# Patient Record
Sex: Female | Born: 1985 | Race: White | Hispanic: No | Marital: Single | Smoking: Never smoker
Health system: Southern US, Community
[De-identification: ages and names within clinical notes are randomized; demographics above are authoritative.]

## PROBLEM LIST (undated history)

## (undated) DIAGNOSIS — R440 Auditory hallucinations: Secondary | ICD-10-CM

## (undated) DIAGNOSIS — F319 Bipolar disorder, unspecified: Secondary | ICD-10-CM

## (undated) HISTORY — PX: VENTRICULOPERITONEAL SHUNT: SHX204

## (undated) HISTORY — PX: EYE SURGERY: SHX253

---

## 2015-11-27 ENCOUNTER — Encounter (HOSPITAL_COMMUNITY): Payer: Self-pay | Admitting: *Deleted

## 2015-11-27 DIAGNOSIS — Z3202 Encounter for pregnancy test, result negative: Secondary | ICD-10-CM | POA: Insufficient documentation

## 2015-11-27 DIAGNOSIS — F22 Delusional disorders: Secondary | ICD-10-CM | POA: Insufficient documentation

## 2015-11-27 DIAGNOSIS — F29 Unspecified psychosis not due to a substance or known physiological condition: Secondary | ICD-10-CM | POA: Insufficient documentation

## 2015-11-27 DIAGNOSIS — F419 Anxiety disorder, unspecified: Secondary | ICD-10-CM | POA: Insufficient documentation

## 2015-11-27 DIAGNOSIS — F17200 Nicotine dependence, unspecified, uncomplicated: Secondary | ICD-10-CM | POA: Insufficient documentation

## 2015-11-27 LAB — CBC
HCT: 43.6 % (ref 36.0–46.0)
HEMOGLOBIN: 15.3 g/dL — AB (ref 12.0–15.0)
MCH: 30.8 pg (ref 26.0–34.0)
MCHC: 35.1 g/dL (ref 30.0–36.0)
MCV: 87.7 fL (ref 78.0–100.0)
Platelets: 192 10*3/uL (ref 150–400)
RBC: 4.97 MIL/uL (ref 3.87–5.11)
RDW: 12.8 % (ref 11.5–15.5)
WBC: 7.5 10*3/uL (ref 4.0–10.5)

## 2015-11-27 LAB — URINALYSIS, ROUTINE W REFLEX MICROSCOPIC
BILIRUBIN URINE: NEGATIVE
Glucose, UA: NEGATIVE mg/dL
Hgb urine dipstick: NEGATIVE
KETONES UR: NEGATIVE mg/dL
Leukocytes, UA: NEGATIVE
NITRITE: NEGATIVE
Protein, ur: NEGATIVE mg/dL
Specific Gravity, Urine: 1.007 (ref 1.005–1.030)
pH: 5.5 (ref 5.0–8.0)

## 2015-11-27 LAB — RAPID URINE DRUG SCREEN, HOSP PERFORMED
Amphetamines: NOT DETECTED
BARBITURATES: NOT DETECTED
Benzodiazepines: NOT DETECTED
Cocaine: NOT DETECTED
Opiates: NOT DETECTED
TETRAHYDROCANNABINOL: NOT DETECTED

## 2015-11-27 LAB — ETHANOL

## 2015-11-27 LAB — COMPREHENSIVE METABOLIC PANEL
ALT: 16 U/L (ref 14–54)
AST: 29 U/L (ref 15–41)
Albumin: 4.4 g/dL (ref 3.5–5.0)
Alkaline Phosphatase: 82 U/L (ref 38–126)
Anion gap: 15 (ref 5–15)
CHLORIDE: 103 mmol/L (ref 101–111)
CO2: 23 mmol/L (ref 22–32)
CREATININE: 0.79 mg/dL (ref 0.44–1.00)
Calcium: 9.7 mg/dL (ref 8.9–10.3)
GFR calc Af Amer: >60 mL/min (ref >60)
Glucose, Bld: 127 mg/dL — ABNORMAL HIGH (ref 65–99)
Potassium: 4.3 mmol/L (ref 3.5–5.1)
Sodium: 141 mmol/L (ref 135–145)
Total Bilirubin: 1.1 mg/dL (ref 0.3–1.2)
Total Protein: 7.8 g/dL (ref 6.5–8.1)

## 2015-11-27 LAB — POC URINE PREG, ED: PREG TEST UR: NEGATIVE

## 2015-11-27 LAB — ACETAMINOPHEN LEVEL: Acetaminophen (Tylenol), Serum: <10 ug/mL — ABNORMAL LOW (ref 10–30)

## 2015-11-27 LAB — SALICYLATE LEVEL: Salicylate Lvl: <4 mg/dL (ref 2.8–30.0)

## 2015-11-27 NOTE — ED Notes (Signed)
Spoke with Tobi Bastosnna (TTS)  Stated once medically cleared we should place a TTS consult

## 2015-11-27 NOTE — ED Notes (Signed)
Leslie Farrell [phone]) from [facility] has assessment completed.  Papers with triage nurse.  Mother here with patient.  No homicidal or suicidal ideations just voices and sometimes they are talking to each other.

## 2015-11-27 NOTE — ED Notes (Signed)
Patient presents with Mother stating "she needs a psych eval".  Patient with flat affect.  Mother states she had an episode like this 6-7 weeks ago while she was in [place].  Delusional, hearing voices but denies wanting to hurt herself or others.

## 2015-11-28 ENCOUNTER — Emergency Department (HOSPITAL_COMMUNITY)
Admission: EM | Admit: 2015-11-28 | Discharge: 2015-11-29 | Disposition: A | Discharge status: Other Acute Inpatient Hospital | Payer: Self-pay | Attending: Emergency Medicine | Admitting: Emergency Medicine

## 2015-11-28 DIAGNOSIS — F23 Brief psychotic disorder: Secondary | ICD-10-CM

## 2015-11-28 DIAGNOSIS — R440 Auditory hallucinations: Secondary | ICD-10-CM

## 2015-11-28 HISTORY — DX: Auditory hallucinations: R44.0

## 2015-11-28 MED ORDER — ZIPRASIDONE MESYLATE 20 MG IM SOLR
20.0000 mg | Freq: Once | INTRAMUSCULAR | Status: AC
  Administered 2015-11-28: 20 mg via INTRAMUSCULAR
  Filled 2015-11-28: qty 20

## 2015-11-28 MED ORDER — STERILE WATER FOR INJECTION IJ SOLN
INTRAMUSCULAR | Status: AC
  Administered 2015-11-28: 10 mL
  Filled 2015-11-28: qty 10

## 2015-11-28 NOTE — ED Notes (Signed)
pts mom called, pt was asking her mom

## 2015-11-28 NOTE — ED Notes (Signed)
Pt walking to bathroom   
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
## 2015-11-28 NOTE — ED Notes (Signed)
Meal given

## 2015-11-28 NOTE — ED Notes (Signed)
Placed original IVC copy, in red folder in Case Managers Office.

## 2015-11-28 NOTE — ED Notes (Signed)
Pt started screaming uncontrollable and shoved chairs and her bedside table to barricade herself.  This RN talked with pt in a calling manner and helped her deescalate. MD was in hallway and gave orders for IM medication and started IVC paperwork.  IM ordered.

## 2015-11-28 NOTE — ED Notes (Signed)
Pt tried to leave her room and go to the lobby.  Pt appears confused and doesn't understand why she is here.  RN used calming techniques to talk with patient.  Pt back in room.  Pt is tearful.

## 2015-11-28 NOTE — ED Notes (Signed)
Pt attempted to leave the unit, she was redirected and went back to her room.  Pt was tearful and mute.

## 2015-11-28 NOTE — ED Notes (Signed)
Faxed IVC papers to the After Hours Magistrate @ 16:51pm

## 2015-11-28 NOTE — ED Notes (Signed)
MD filled out IVC and this RN gave to Diplomatic Services operational officersecretary to [employer] and fax to [employer]istrate.

## 2015-11-28 NOTE — ED Notes (Signed)
Mother at bedside.

## 2015-11-28 NOTE — ED Notes (Signed)
Regular lunch tray ordered for pt @ 6:59a

## 2015-11-28 NOTE — Progress Notes (Addendum)
Spoke with pt's mother Leslie Farrell at [phone]. Mother requests to be updated when pt is placed. Mother states pt lived in [place], where family is originally from, until 8 weeks ago when [place]. Mother explains for that reason details of pt's mental health issues during past few months are not entirely known and are now being discovered by family. Mother states 1 yr ago pt broke up with long-term boyfriend and lsot her job, and she seemed to decompensate at that time and saw a psychologist for therapy. States psychologist referred pt to psychiatry for medication and pt took lamotrigine and propanolol for unknown amount of time. States pt reported it was ineffective and "made her feel worse, and she didn't have the finances to keep seeing the psychiatrist." States she believes pt was diagnosed with bipolar disorder at the time. Has never been admitted to inpatient psychiatric treatment.   Mother states that pt seemed fixated on conspiracy theories and somewhat paranoid about others being out to get her when she initially moved home, and family attributed this to her having recently stopped taking her medications. States pt seemed to improve for a few weeks, however, mother states that, upon parents returning from out of town this past weekend, they observed pt had significantly decompensated further: paranoid, disoriented, confused, not sleeping or eating. States this is the first occurrence of psychotic symptoms to their knowledge. Reports there is a family hx of schizophrenia (pt's cousin).  Reports pt is not currently [place] and applied for MCD when first moved but was told she did not qualify. States they plan to apply again in order to access more MH services for pt unless she begins working and accesses other health insurance in the meantime.  Mother aware inpatient treatment is being recommended and hopes to be involved in pt's treatment once admitted in  order to "help give an accurate history and description of her- she can't express herself effectively in this state and I know feedback will be important when finding the right medications and treatment for this." States, "our biggest fear is that she'll be transferred somewhere and we won't be able to find her." Mother requests to be kept updated when possible at number above.   [facility]- per Christiane H[facility]- per [facility]- per [facility] MartinsKristin [facility]- per Billie  [facility] and will refer if there is bed availability. Also [facility] upon appropriate bed availability.  [facility]: [facility]  Ilean SkillMeghan Kristina Mcnorton, MSW, LCSW Clinical Social Work, Disposition  11/28/2015 [phone]

## 2015-11-28 NOTE — ED Notes (Signed)
Pts mother took all of pts belongings home with her.

## 2015-11-28 NOTE — ED Notes (Signed)
Patient stated that she wanted to take a shower. Was given items to shower with and patient now yelling "no, not right now!" and turns in bed with covers over her face. Items placed at bedside for when pt is ready.

## 2015-11-28 NOTE — ED Provider Notes (Signed)
CSN: [id number]     Arrival date & time 11/27/15  1941 History   First MD Initiated Contact with Patient 11/28/15 0031     Chief Complaint  Patient presents with  . Psychiatric Evaluation     (Consider location/radiation/quality/duration/timing/severity/associated sxs/prior Treatment) HPI Comments: 30 year old female presents to the emergency department for psychiatric evaluation. Patient has been experiencing hallucinations for the past 2 months, per mother. Symptoms have been waxing and waning in severity. Patient, most recently, reports hearing voices for 2-3 days. She states that the voices are telling her about wars. Mother reports that patient seems concerned about conspiracies. Patient has noted tinnitus in her bilateral ears as well as a buzzing sensation to the top of her head. She has taken ibuprofen for these symptoms. Patient has previously been on propranolol and lamotrigine for hallucinations which, patient states, provided her little relief. No suicidal or homicidal ideations. Patient denies and hallucinations.  The history is provided by the patient and a parent. No language interpreter was used.    Past Medical History  Diagnosis Date  . Hearing voices    History reviewed. No pertinent past surgical history. No family history on file. Social History  Substance Use Topics  . Smoking status: Current Some Day Smoker  . Smokeless tobacco: Never Used  . Alcohol Use: Yes   OB History    No data available      Review of Systems  Psychiatric/Behavioral: Positive for hallucinations and behavioral problems. The patient is nervous/anxious.   All other systems reviewed and are negative.   Allergies  Review of patient's allergies indicates no known allergies.  Home Medications   Prior to Admission medications   Not on File   BP 136/91 mmHg  Pulse 72  Temp(Src) 98.5 F (36.9 C) (Oral)  Resp 18  SpO2 100%  LMP 11/21/2015   Physical Exam  Constitutional: She is  oriented to person, place, and time. She appears well-developed and well-nourished. No distress.  HENT:  Head: Normocephalic and atraumatic.  Eyes: Conjunctivae and EOM are normal. No scleral icterus.  Neck: Normal range of motion.  Pulmonary/Chest: Effort normal. No respiratory distress. She has no wheezes.  Musculoskeletal: Normal range of motion.  Neurological: She is alert and oriented to person, place, and time. She exhibits normal muscle tone. Coordination normal.  Skin: Skin is warm and dry. No rash noted. She is not diaphoretic. No erythema. No pallor.  Psychiatric: Her speech is normal. Her mood appears anxious. She is slowed (mild) and withdrawn. Thought content is paranoid. She expresses no homicidal and no suicidal ideation.  Nursing note and vitals reviewed.   ED Course  Procedures (including critical care time) Labs Review Labs Reviewed  COMPREHENSIVE METABOLIC PANEL - Abnormal; Notable for the following:    Glucose, Bld 127 (*)    BUN <5 (*)    All other components within normal limits  ACETAMINOPHEN LEVEL - Abnormal; Notable for the following:    Acetaminophen (Tylenol), Serum <10 (*)    All other components within normal limits  CBC - Abnormal; Notable for the following:    Hemoglobin 15.3 (*)    All other components within normal limits  ETHANOL  SALICYLATE LEVEL  URINE RAPID DRUG SCREEN, HOSP PERFORMED  URINALYSIS, ROUTINE W REFLEX MICROSCOPIC (NOT AT [facility])  POC URINE PREG, ED    Imaging Review No results found. I have personally reviewed and evaluated these images and lab results as part of my medical decision-making.   EKG Interpretation None  MDM   Final diagnoses:  Auditory hallucinations  Acute psychosis    Patient medically cleared. She is pending inpatient placement for further psychiatric management. Disposition to be determined by oncoming ED provider.   Filed Vitals:   11/27/15 2003 11/28/15 0100  BP: 145/88 136/91  Pulse: 84 72   Temp: 98.5 F (36.9 C)   TempSrc: Oral   Resp: 18   SpO2: 100% 100%      Antony Madura, PA-C 11/28/15 [id number]  Alvira Monday, MD 12/01/15 2049

## 2015-11-28 NOTE — ED Notes (Signed)
Notified staffing for a sitter.

## 2015-11-28 NOTE — ED Notes (Signed)
Got breakfast tray

## 2015-11-28 NOTE — ED Notes (Signed)
Mother went home pt resting

## 2015-11-28 NOTE — Progress Notes (Addendum)
Patient's mom called Clinical research associatewriter inquiring about phone number for RN so that she could ask RN about type of clothing that she could bring to the ED for patient. [facility] phone number provided.  Melbourne Abtsatia Kyran Connaughton, LCSWA Disposition staff 11/28/2015 4:10 PM

## 2015-11-28 NOTE — ED Notes (Signed)
Pt up to ambulatory to BR

## 2015-11-28 NOTE — ED Notes (Signed)
Pt voluntarily received an IM medication and was able to ambulate to call her mother.  Pt is in her room, laying down with eyes closed.  Sitter at bedside.

## 2015-11-28 NOTE — ED Notes (Addendum)
Pt yelling but has no complaints when asked.  Pt states she talked to this RN about going home this RN stated that they are looking for placement and she is not going home at this time.  Pt is convinced that she had a conversation that didn't happen.pt wondering around room in a confused manner.

## 2015-11-29 NOTE — ED Provider Notes (Signed)
[facility]...  EMTALA completed  Eber HongBrian Zarina Pe, MD 11/29/15 [phone]

## 2015-11-29 NOTE — ED Notes (Signed)
Pts shoes and bra inventoried and placed in storage

## 2015-11-29 NOTE — ED Notes (Signed)
Patient sleeping at this time. Will do VS again at 6am.

## 2015-11-29 NOTE — ED Notes (Signed)
Magistrate's office called regarding patient not being served following IVC paperwork. IVC papers resent to magistrate.

## 2015-11-29 NOTE — ED Notes (Addendum)
Pt ambulated to the restroom in a steady manner.  Pt eating and drinking without issue.  Calm at this time.

## 2015-11-29 NOTE — ED Notes (Signed)
Patient was given a snack and drink, a regular diet ordered for lunch. 

## 2015-11-29 NOTE — ED Notes (Signed)
Patient ambulatory to restroom with steady gait, NAD noted.

## 2015-11-29 NOTE — ED Notes (Signed)
IVC paperwork finally reached Magistrate's office. Magistrate called Marylene Landngela, NS back, stating that the reasoning is "inconclusive" and that there must be facts stating why the patient must be involuntarily committed. Will inform MD to re-evaluate and complete IVC paperwork again.

## 2015-11-29 NOTE — ED Notes (Signed)
IVC papers were not approved by the Magistrate.  Pt is not in crisis at this time, MD will wait on IVC papers if patient becomes a danger to herself or others.

## 2015-11-29 NOTE — Progress Notes (Addendum)
[facility] Dr [facility]iden[facility] has accepted pt for admission to adult unit. Report number is [id number], Leslie Farrell requThayer Farrell report be called when transport has arrived for pt. [facility] [address], [place], [place] [address]  Pt is not under IVC at this time. Leslie Farrell states that voluntary admission is offered, "with understanding that our policy is, if pt arrives here and is no longer willing to sign self in voluntarily, she will be returned to the sending facility."   Attempted to reach pt's mother to inform her of placement- left voicemail. However, [facility], mother present in ED and aware of pending transfer.   Leslie Farrell, MSW, LCSW Clinical Social Work, Disposition  11/29/2015 [phone]

## 2015-11-29 NOTE — Progress Notes (Signed)
Received call from pt's mother [place]indy [place] [phone]. Mother requests update re: pt's case. Mother frustrated that visiting hours are limited in ED and she cannot be present to "help her manage her affairs." Mother upset that pt may "wait several days for a bed." CSW informed mother that pt is [facility] as soon as there is bed availability, but that placement is being sought at any other appropriate facility in the state in the meantime and order to ensure pt access treatment as quickly as possible. Pt's mother expresses frustration that, although she wants pt transferred to treatment, "she could have to go 3 hours away and not have family support there." CSW offered support for mother's concerns while maintaining that pt is being referred in efforts to have her access timely treatment. Mother asks "what is she supposed to do about her financial affairs in the meantime? I can't become her POA until she can sign it lucidly." CSW stated cannot advise as to financial concerns or POA/legal affairs.   Pt under review at: [facility]- per Sabino NiemannKristin [facility]- [facility]  Sent referral again with updated vitals/ED notes to: [facility]- per [facility]- [facility]  [place], [facility], [facility] at [facility].  Ilean SkillMeghan Tonyetta Berko, MSW, LCSW Clinical Social Work, Disposition  11/29/2015 [phone]

## 2016-02-14 ENCOUNTER — Encounter (HOSPITAL_COMMUNITY): Payer: Self-pay | Admitting: Emergency Medicine

## 2016-02-14 ENCOUNTER — Emergency Department (HOSPITAL_COMMUNITY)
Admission: EM | Admit: 2016-02-14 | Discharge: 2016-02-14 | Disposition: A | Discharge status: Home / Self Care | Payer: BLUE CROSS/BLUE SHIELD | Attending: Emergency Medicine | Admitting: Emergency Medicine

## 2016-02-14 ENCOUNTER — Emergency Department (HOSPITAL_COMMUNITY): Payer: BLUE CROSS/BLUE SHIELD

## 2016-02-14 DIAGNOSIS — Y939 Activity, unspecified: Secondary | ICD-10-CM | POA: Diagnosis not present

## 2016-02-14 DIAGNOSIS — Z79899 Other long term (current) drug therapy: Secondary | ICD-10-CM | POA: Insufficient documentation

## 2016-02-14 DIAGNOSIS — Y92009 Unspecified place in unspecified non-institutional (private) residence as the place of occurrence of the external cause: Secondary | ICD-10-CM | POA: Diagnosis not present

## 2016-02-14 DIAGNOSIS — F17200 Nicotine dependence, unspecified, uncomplicated: Secondary | ICD-10-CM | POA: Diagnosis not present

## 2016-02-14 DIAGNOSIS — S51851A Open bite of right forearm, initial encounter: Secondary | ICD-10-CM | POA: Insufficient documentation

## 2016-02-14 DIAGNOSIS — Y999 Unspecified external cause status: Secondary | ICD-10-CM | POA: Diagnosis not present

## 2016-02-14 DIAGNOSIS — W540XXA Bitten by dog, initial encounter: Secondary | ICD-10-CM | POA: Insufficient documentation

## 2016-02-14 MED ORDER — LIDOCAINE-EPINEPHRINE (PF) 1 %-1:200000 IJ SOLN
INTRAMUSCULAR | Status: AC
  Administered 2016-02-14: 30 mL
  Filled 2016-02-14: qty 30

## 2016-02-14 MED ORDER — TETANUS-DIPHTH-ACELL PERTUSSIS 5-2.5-18.5 LF-MCG/0.5 IM SUSP
0.5000 mL | Freq: Once | INTRAMUSCULAR | Status: AC
  Administered 2016-02-14: 0.5 mL via INTRAMUSCULAR
  Filled 2016-02-14: qty 0.5

## 2016-02-14 MED ORDER — IBUPROFEN 600 MG PO TABS: 600.0000 mg | ORAL_TABLET | Freq: Four times a day (QID) | ORAL | Status: DC | PRN

## 2016-02-14 MED ORDER — LIDOCAINE-EPINEPHRINE (PF) 2 %-1:200000 IJ SOLN: 10.0000 mL | Freq: Once | INTRAMUSCULAR | Status: DC

## 2016-02-14 MED ORDER — HYDROCODONE-ACETAMINOPHEN 5-325 MG PO TABS: 1.0000 | ORAL_TABLET | ORAL | Status: DC | PRN

## 2016-02-14 MED ORDER — OXYCODONE-ACETAMINOPHEN 5-325 MG PO TABS
1.0000 | ORAL_TABLET | ORAL | Status: DC | PRN
  Administered 2016-02-14: 1 via ORAL
  Filled 2016-02-14: qty 1

## 2016-02-14 MED ORDER — AMOXICILLIN-POT CLAVULANATE 875-125 MG PO TABS: 1.0000 | ORAL_TABLET | Freq: Two times a day (BID) | ORAL | Status: DC
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
## 2016-02-14 NOTE — ED Notes (Signed)
PA at bedside.

## 2016-02-14 NOTE — ED Notes (Signed)
Pt reports understanding of discharge information. No questions at time of discharge 

## 2016-02-14 NOTE — Discharge Instructions (Signed)
Take your medications as prescribed. You may also apply ice to affected areas for 15-20 minutes 3-4 times daily to help with pain and swelling. Keep wound clean using antibacterial soap (Dial) and water and pat dry.  I recommend following up with your primary care provider in 2 days for wound recheck. Return to the ED or be seen by your primary care provider in 7 days for suture removal.  Please return to the Emergency Department if symptoms worsen or new onset of fever, swelling, redness, drainage, numbness, tingling, weakness, decreased range of motion of right arm.

## 2016-02-14 NOTE — ED Provider Notes (Signed)
CSN: [id number]     Arrival date & time 02/14/16  1934 History   First MD Initiated Contact with Patient 02/14/16 2110     Chief Complaint  Patient presents with  . Animal Bite     (Consider location/radiation/quality/duration/timing/severity/associated sxs/prior Treatment) HPI   Patient is a 30 year old female with past medical history of schizophrenia who presents to the ED with report of dog bite, onset 7 PM. Patient reports she is feeding her 2 dogs when they began to fight between each other. She notes she tried to break them up which resulted in one of her dogs biting her right forearm. She reports having two wounds to her right forearm, bleeding controlled. Denies fever,  numbness, tingling, weakness. Tetanus not UTD. Pt reports the dog's vaccines and rabies are UTD.   Past Medical History  Diagnosis Date  . Hearing voices    History reviewed. No pertinent past surgical history. No family history on file. Social History  Substance Use Topics  . Smoking status: Current Some Day Smoker  . Smokeless tobacco: Never Used  . Alcohol Use: Yes   OB History    No data available     Review of Systems  Constitutional: Negative for fever.  Musculoskeletal: Positive for myalgias (right forearm).  Skin: Positive for wound.  Neurological: Negative for weakness and numbness.      Allergies  Review of patient's allergies indicates no known allergies.  Home Medications   Prior to Admission medications   Medication Sig Start Date End Date Taking? Authorizing Provider  citalopram (CELEXA) 20 MG tablet TK 1 T PO  QAM 02/06/16  Yes Historical Provider, MD  OLANZapine (ZYPREXA) 10 MG tablet TK ONE T PO Q NIGHT 02/06/16  Yes Historical Provider, MD  amoxicillin-clavulanate (AUGMENTIN) 875-125 MG tablet Take 1 tablet by mouth every 12 (twelve) hours. 02/14/16   Barrett Henle, PA-C  HYDROcodone-acetaminophen (NORCO/VICODIN) 5-325 MG tablet Take 1 tablet by mouth every 4 (four) hours  as needed. 02/14/16   Barrett Henle, PA-C  ibuprofen (ADVIL,MOTRIN) 600 MG tablet Take 1 tablet (600 mg total) by mouth every 6 (six) hours as needed. 02/14/16   Satira Sark Ansen Sayegh, PA-C   BP 112/73 mmHg  Pulse 98  Temp(Src) 99 F (37.2 C) (Oral)  Resp 18  SpO2 100%  LMP 02/13/2016 Physical Exam  Constitutional: She is oriented to person, place, and time. She appears well-developed and well-nourished.  HENT:  Head: Normocephalic and atraumatic.  Eyes: Conjunctivae and EOM are normal. Right eye exhibits no discharge. Left eye exhibits no discharge. No scleral icterus.  Neck: Normal range of motion. Neck supple.  Cardiovascular: Normal rate and intact distal pulses.   Pulmonary/Chest: Effort normal. No respiratory distress.  Abdominal: Soft. She exhibits no distension.  Musculoskeletal: Normal range of motion. She exhibits tenderness. She exhibits no edema.       Left forearm: She exhibits tenderness and laceration. She exhibits no bony tenderness, no swelling, no edema and no deformity.       Arms: 3x1cm superficial laceration noted volar aspect of right proximal forearm, no active bleeding.  0.5x0.5cm puncture wound noted to dorsal aspect of right proximal forearm, no active bleeding.  TTP around wounds.  FROM of right shoulder, elbow, forearm, wrist and hand with 5/5 strength. 2+ radial pulses. Sensation grossly intact.   Neurological: She is alert and oriented to person, place, and time.  Skin: Skin is warm and dry.  Nursing note and vitals reviewed.   ED Course  .Marland Kitchen  Laceration Repair Date/Time: 02/14/2016 11:10 PM Performed by: Barrett HenleNADEAU, Aarini Slee ELIZABETH Authorized by: Barrett HenleNADEAU, Lasaundra Riche ELIZABETH Consent: Verbal consent obtained. Risks and benefits: risks, benefits and alternatives were discussed Consent given by: patient Patient understanding: patient states understanding of the procedure being performed Patient identity confirmed: verbally with patient Body area: upper  extremity Location details: right upper arm Laceration length: 3 cm Foreign bodies: no foreign bodies Tendon involvement: none Nerve involvement: none Vascular damage: no Anesthesia: local infiltration Local anesthetic: lidocaine 2% with epinephrine Anesthetic total: 4 ml Preparation: Patient was prepped and draped in the usual sterile fashion. Irrigation solution: saline Irrigation method: syringe Amount of cleaning: extensive Skin closure: Ethilon (4-0) Number of sutures: 5 Technique: simple Approximation: close Approximation difficulty: simple Dressing: 4x4 sterile gauze Patient tolerance: Patient tolerated the procedure well with no immediate complications Comments: Puncture wound was also copiously irrigated with NS and wound was left open.    (including critical care time) Labs Review Labs Reviewed - No data to display  Imaging Review Dg Forearm Right  02/14/2016  CLINICAL DATA:  Dog bite proximal forearm today EXAM: RIGHT FOREARM - 2 VIEW COMPARISON:  None. FINDINGS: Two views of the right forearm submitted. No acute fracture or subluxation. Skin and soft tissue irregularity proximal forearm probable injury. Clinical correlation is necessary. IMPRESSION: No fracture or subluxation. Probable soft tissue injury proximal forearm. Clinical correlation is necessary. Electronically Signed   By: Natasha MeadLiviu  Pop M.D.   On: 02/14/2016 20:33   I have personally reviewed and evaluated these images and lab results as part of my medical decision-making.   EKG Interpretation None      MDM   Final diagnoses:  Dog bite    Pt presents with wounds to her right forearm due to being bit by her dog at home. Dog's vaccine and rabies are UTD. Pt's tetanus is not UTD. Denies numbness, tingling, weakness, no active bleeding. VSS. Exam revealed small puncture wound to right volar proximal forearm and laceration to dorsal aspect of right forearm, no active bleeding. Right arm neurovascularly  intact. Tetanus updated in the ED. Right forearm xray negative. Pressure irrigation performed. Wound explored and base of wound visualized in a bloodless field without evidence of foreign body, subcutaneous fat and fascia visible.  Laceration occurred < 8 hours prior to repair which was well tolerated. Pt has no comorbidities to effect normal wound healing. Pt discharged with antibiotics due to wound being from dog bite.  Discussed suture home care with patient and answered questions. Pt to follow-up for wound check and suture removal in 7 days; they are to return to the ED sooner for signs of infection. Pt is hemodynamically stable with no complaints prior to dc.      Satira Sarkicole Elizabeth [place], [place] Jersey[place]-C 02/15/16 0038  Jacalyn LefevreJulie Haviland, MD 02/15/16 [phone]

## 2016-03-07 ENCOUNTER — Encounter (HOSPITAL_BASED_OUTPATIENT_CLINIC_OR_DEPARTMENT_OTHER): Payer: Self-pay | Admitting: *Deleted

## 2016-03-07 ENCOUNTER — Emergency Department (HOSPITAL_BASED_OUTPATIENT_CLINIC_OR_DEPARTMENT_OTHER): Payer: BLUE CROSS/BLUE SHIELD

## 2016-03-07 ENCOUNTER — Emergency Department (HOSPITAL_BASED_OUTPATIENT_CLINIC_OR_DEPARTMENT_OTHER)
Admission: EM | Admit: 2016-03-07 | Discharge: 2016-03-07 | Disposition: A | Discharge status: Home / Self Care | Payer: BLUE CROSS/BLUE SHIELD | Attending: Emergency Medicine | Admitting: Emergency Medicine

## 2016-03-07 DIAGNOSIS — Y92410 Unspecified street and highway as the place of occurrence of the external cause: Secondary | ICD-10-CM | POA: Insufficient documentation

## 2016-03-07 DIAGNOSIS — Z79899 Other long term (current) drug therapy: Secondary | ICD-10-CM | POA: Insufficient documentation

## 2016-03-07 DIAGNOSIS — S161XXA Strain of muscle, fascia and tendon at neck level, initial encounter: Secondary | ICD-10-CM

## 2016-03-07 DIAGNOSIS — Y999 Unspecified external cause status: Secondary | ICD-10-CM | POA: Diagnosis not present

## 2016-03-07 DIAGNOSIS — Y9389 Activity, other specified: Secondary | ICD-10-CM | POA: Diagnosis not present

## 2016-03-07 DIAGNOSIS — F319 Bipolar disorder, unspecified: Secondary | ICD-10-CM | POA: Insufficient documentation

## 2016-03-07 DIAGNOSIS — S20319A Abrasion of unspecified front wall of thorax, initial encounter: Secondary | ICD-10-CM | POA: Diagnosis not present

## 2016-03-07 DIAGNOSIS — M542 Cervicalgia: Secondary | ICD-10-CM | POA: Diagnosis present

## 2016-03-07 HISTORY — DX: Bipolar disorder, unspecified: F31.9

## 2016-03-07 MED ORDER — IBUPROFEN 400 MG PO TABS
600.0000 mg | ORAL_TABLET | Freq: Once | ORAL | Status: DC | PRN
  Filled 2016-03-07: qty 1

## 2016-03-07 MED ORDER — TRAMADOL HCL 50 MG PO TABS
50.0000 mg | ORAL_TABLET | Freq: Once | ORAL | Status: AC
  Administered 2016-03-07: 50 mg via ORAL
  Filled 2016-03-07: qty 1

## 2016-03-07 MED ORDER — METHOCARBAMOL 500 MG PO TABS: 1000.0000 mg | ORAL_TABLET | Freq: Three times a day (TID) | ORAL | Status: DC | PRN

## 2016-03-07 MED ORDER — TRAMADOL HCL 50 MG PO TABS: 50.0000 mg | ORAL_TABLET | Freq: Four times a day (QID) | ORAL | Status: DC | PRN

## 2016-03-07 MED ORDER — METHOCARBAMOL 500 MG PO TABS
1000.0000 mg | ORAL_TABLET | Freq: Once | ORAL | Status: AC
  Administered 2016-03-07: 1000 mg via ORAL
  Filled 2016-03-07: qty 2
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
## 2016-03-07 NOTE — Discharge Instructions (Signed)
Muscle Strain  A muscle strain is an injury that occurs when a muscle is stretched beyond its normal length. Usually a small number of muscle fibers are torn when this happens. Muscle strain is rated in degrees. First-degree strains have the least amount of muscle fiber tearing and pain. Second-degree and third-degree strains have increasingly more tearing and pain.   Usually, recovery from muscle strain takes 1-2 weeks. Complete healing takes 5-6 weeks.   CAUSES   Muscle strain happens when a sudden, violent force placed on a muscle stretches it too far. This may occur with lifting, sports, or a fall.   RISK FACTORS  Muscle strain is especially common in athletes.   SIGNS AND SYMPTOMS  At the site of the muscle strain, there may be:   Pain.   Bruising.   Swelling.   Difficulty using the muscle due to pain or lack of normal function.  DIAGNOSIS   Your health care provider will perform a physical exam and ask about your medical history.  TREATMENT   Often, the best treatment for a muscle strain is resting, icing, and applying cold compresses to the injured area.   HOME CARE INSTRUCTIONS    Use the PRICE method of treatment to promote muscle healing during the first 2-3 days after your injury. The PRICE method involves:    Protecting the muscle from being injured again.    Restricting your activity and resting the injured body part.    Icing your injury. To do this, put ice in a plastic bag. Place a towel between your skin and the bag. Then, apply the ice and leave it on from 15-20 minutes each hour. After the third day, switch to moist heat packs.    Apply compression to the injured area with a splint or elastic bandage. Be careful not to wrap it too tightly. This may interfere with blood circulation or increase swelling.    Elevate the injured body part above the level of your heart as often as you can.   Only take over-the-counter or prescription medicines for pain, discomfort, or fever as directed by your  health care provider.   Warming up prior to exercise helps to prevent future muscle strains.  SEEK MEDICAL CARE IF:    You have increasing pain or swelling in the injured area.   You have numbness, tingling, or a significant loss of strength in the injured area.  MAKE SURE YOU:    Understand these instructions.   Will watch your condition.   Will get help right away if you are not doing well or get worse.     This information is not intended to replace advice given to you by your health care provider. Make sure you discuss any questions you have with your health care provider.     Document Released: 08/24/2005 Document Revised: 06/14/2013 Document Reviewed: 03/23/2013  Elsevier Interactive Patient Education 2016 [employer]  Motor Vehicle Collision  It is common to have multiple bruises and sore muscles after a motor vehicle collision (MVC). These tend to feel worse for the first 24 hours. You may have the most stiffness and soreness over the first several hours. You may also feel worse when you wake up the first morning after your collision. After this point, you will usually begin to improve with each day. The speed of improvement often depends on the severity of the collision, the number of injuries, and the location and nature of these injuries.  HOME CARE INSTRUCTIONS     Put ice on the injured area.    Put ice in a plastic bag.    Place a towel between your skin and the bag.    Leave the ice on for 15-20 minutes, 3-4 times a day, or as directed by your health care provider.   Drink enough fluids to keep your urine clear or pale yellow. Do not drink alcohol.   Take a warm shower or bath once or twice a day. This will increase blood flow to sore muscles.   You may return to activities as directed by your caregiver. Be careful when lifting, as this may aggravate neck or back pain.   Only take over-the-counter or prescription medicines for pain, discomfort, or fever as directed by your caregiver. Do  not use aspirin. This may increase bruising and bleeding.  SEEK IMMEDIATE MEDICAL CARE IF:   You have numbness, tingling, or weakness in the arms or legs.   You develop severe headaches not relieved with medicine.   You have severe neck pain, especially tenderness in the middle of the back of your neck.   You have changes in bowel or bladder control.   There is increasing pain in any area of the body.   You have shortness of breath, light-headedness, dizziness, or fainting.   You have chest pain.   You feel sick to your stomach (nauseous), throw up (vomit), or sweat.   You have increasing abdominal discomfort.   There is blood in your urine, stool, or vomit.   You have pain in your shoulder (shoulder strap areas).   You feel your symptoms are getting worse.  MAKE SURE YOU:   Understand these instructions.   Will watch your condition.   Will get help right away if you are not doing well or get worse.     This information is not intended to replace advice given to you by your health care provider. Make sure you discuss any questions you have with your health care provider.     Document Released: 08/24/2005 Document Revised: 09/14/2014 Document Reviewed: 01/21/2011  Elsevier Interactive Patient Education 2016 Elsevier Inc.
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
## 2016-09-02 ENCOUNTER — Emergency Department (HOSPITAL_COMMUNITY)
Admission: EM | Admit: 2016-09-02 | Discharge: 2016-09-03 | Disposition: A | Discharge status: Other Acute Inpatient Hospital | Payer: BLUE CROSS/BLUE SHIELD | Attending: Emergency Medicine | Admitting: Emergency Medicine

## 2016-09-02 ENCOUNTER — Encounter (HOSPITAL_COMMUNITY): Payer: Self-pay | Admitting: Emergency Medicine

## 2016-09-02 DIAGNOSIS — Z79899 Other long term (current) drug therapy: Secondary | ICD-10-CM | POA: Insufficient documentation

## 2016-09-02 DIAGNOSIS — Z9889 Other specified postprocedural states: Secondary | ICD-10-CM | POA: Diagnosis not present

## 2016-09-02 DIAGNOSIS — F3489 Other specified persistent mood disorders: Secondary | ICD-10-CM | POA: Diagnosis present

## 2016-09-02 DIAGNOSIS — F315 Bipolar disorder, current episode depressed, severe, with psychotic features: Secondary | ICD-10-CM | POA: Diagnosis not present

## 2016-09-02 LAB — CBC
HEMATOCRIT: 39.8 % (ref 36.0–46.0)
Hemoglobin: 14.1 g/dL (ref 12.0–15.0)
MCH: 29.9 pg (ref 26.0–34.0)
MCHC: 35.4 g/dL (ref 30.0–36.0)
MCV: 84.5 fL (ref 78.0–100.0)
Platelets: 282 10*3/uL (ref 150–400)
RBC: 4.71 MIL/uL (ref 3.87–5.11)
RDW: 12.1 % (ref 11.5–15.5)
WBC: 9.3 10*3/uL (ref 4.0–10.5)

## 2016-09-02 LAB — COMPREHENSIVE METABOLIC PANEL
ALBUMIN: 4.6 g/dL (ref 3.5–5.0)
ALT: 15 U/L (ref 14–54)
AST: 23 U/L (ref 15–41)
Alkaline Phosphatase: 66 U/L (ref 38–126)
Anion gap: 8 (ref 5–15)
BILIRUBIN TOTAL: 0.8 mg/dL (ref 0.3–1.2)
BUN: 8 mg/dL (ref 6–20)
CO2: 27 mmol/L (ref 22–32)
Calcium: 9.2 mg/dL (ref 8.9–10.3)
Chloride: 106 mmol/L (ref 101–111)
Creatinine, Ser: 0.74 mg/dL (ref 0.44–1.00)
GFR calc Af Amer: >60 mL/min (ref >60)
GFR calc non Af Amer: >60 mL/min (ref >60)
GLUCOSE: 141 mg/dL — AB (ref 65–99)
POTASSIUM: 3.7 mmol/L (ref 3.5–5.1)
Sodium: 141 mmol/L (ref 135–145)
TOTAL PROTEIN: 7 g/dL (ref 6.5–8.1)

## 2016-09-02 LAB — ETHANOL: Alcohol, Ethyl (B): <5 mg/dL (ref <5)

## 2016-09-02 LAB — SALICYLATE LEVEL: Salicylate Lvl: <7 mg/dL (ref 2.8–30.0)

## 2016-09-02 LAB — RAPID URINE DRUG SCREEN, HOSP PERFORMED
AMPHETAMINES: NOT DETECTED
BARBITURATES: NOT DETECTED
BENZODIAZEPINES: NOT DETECTED
Cocaine: NOT DETECTED
Opiates: NOT DETECTED
Tetrahydrocannabinol: NOT DETECTED

## 2016-09-02 LAB — ACETAMINOPHEN LEVEL: Acetaminophen (Tylenol), Serum: <10 ug/mL — ABNORMAL LOW (ref 10–30)

## 2016-09-02 LAB — POC URINE PREG, ED: PREG TEST UR: NEGATIVE

## 2016-09-02 MED ORDER — ACETAMINOPHEN 325 MG PO TABS: 650.0000 mg | ORAL_TABLET | ORAL | Status: DC | PRN

## 2016-09-02 MED ORDER — LORAZEPAM 1 MG PO TABS: 1.0000 mg | ORAL_TABLET | Freq: Three times a day (TID) | ORAL | Status: DC | PRN

## 2016-09-02 MED ORDER — CITALOPRAM HYDROBROMIDE 10 MG PO TABS
20.0000 mg | ORAL_TABLET | Freq: Every day | ORAL | Status: DC
  Administered 2016-09-02 – 2016-09-03 (×2): 20 mg via ORAL
  Filled 2016-09-02 (×2): qty 2

## 2016-09-02 MED ORDER — OLANZAPINE 10 MG PO TABS
10.0000 mg | ORAL_TABLET | Freq: Every day | ORAL | Status: DC
  Administered 2016-09-02: 10 mg via ORAL
  Filled 2016-09-02: qty 1

## 2016-09-02 MED ORDER — CITALOPRAM HYDROBROMIDE 10 MG/5ML PO SOLN: 20.0000 mg | Freq: Every day | ORAL | Status: DC

## 2016-09-02 NOTE — ED Notes (Signed)
Pt's mother took belongings home.

## 2016-09-02 NOTE — ED Notes (Addendum)
Pt here with hx of schizoaffective disorder. Pt states she has been off her medicaitons. Pt is here with her mother who states she has been attempting to manage pt's meds for her for the past 4 days when she realized pt was not taking meds. Pt's mother states this has made things worse. Pt states she is hearing voices of "war strategies" Pt is tearful at time of assessment. Pt states she has not been sleeping and she lost her job. Pt states she canceled all of her appointments with her therapist who she was seeing twice a month  Pt also has complaints of diarrhea and pain with urination

## 2016-09-02 NOTE — ED Notes (Signed)
Attempted to draw blood but unsuccessful.  Call placed to main lab. Spoke to [place] and she will come and draw the blood.

## 2016-09-02 NOTE — BH Assessment (Addendum)
Tele Farrell Note   Leslie Farrell is an 30 y.o. female, who presents voluntarily and accompanied by her mother to [facility]rell. Pt reported, she was fired from work. Pt was a poor historian due to altered mental status and she continued to change her responses throughout the Farrell. Pt's mother reported, they had seven months of greatness while the pt was taking her medication. Pt's mother reported, the pt told her four versions of what happened to her medications: she was on her medications, she went off her medications, her dosage was changed and her medications was changed. Pt's mother reported, she observed a slow progression of the pt within the last 2-3 weeks marked by mumbling and flat affect. Pt reported, she does not remember what happened to her medications and she does not know how long she has been off her medications. Pt's mother reported, this is the pt's third episode, where she is forgetful-"childlike", confused, stops eating, experiencing delusions/hallucinations, loosing time. Pt reported, she hears war tactics, has a chip in her head and says "who killed my baby?" Pt's mother reported, the pt had a miscarriage a while ago.  Pt reported, her ex-boyfriends grandmother is a coyotea [place]("Mexican witch doctor"). Pt reported, she does not eat because she continues to poop/pee and everything stinks (when there is no smell). Pt's mother reported the pt told her her dad always want her to kill herself. Pt denies SI, HI, and self-injurious behaviors. Pt reported, experiencing the following depressive/bipolar symptoms: tearfulness, irritability, isolating, hypermania, decrease appetite, decrease sleep (pt reported, she has insomnia).   Pt denied experiencing verbal, physical and sexual abuse. Pt reported, smoking "a billion" cigarettes daily. Pt's mother reported, the pt smokes about six cigarettes daily because she steals hers. Pt reported, being followed by Leslie Farrell for medication management. Pt reported,  she last seen Leslie Farrell about three weeks ago and could not recall the nature of her visit (if her medications were changes, dosage was lowered.) Pt's mother reported, the pt has an appointment with Leslie Farrell on 09/10/2016. Pt reported, [facility] a month ago. Pt reported, a previous inpatient admission at a hospital in [place], [place] in April 2017 for delusions/altered mental status.   Pt presents quite/awake in scrubs with word salad speech. Pt's eye contact was fair. Pt's mood was labile. Pt's affect was flat. Pt's thought process was circumstantial, irrelevant and tangential. Pt's judgement was impaired. Pt's concentration was fair. Pt's insight and impulse control was poor. Pt was oriented x4 (date, year, city, and state).  The pt appears tp be responding to internal stimuli or experiencing delusional thought content. Pt's mother reported, she feels the pt would not be safe outside of Leslie Farrell. Pt reported, if inpatient treatment is recommended she will sign in voluntarily.   Diagnosis: Bi-polar 1 Disorder (HCC)  Past Medical History:  Past Medical History:  Diagnosis Date  . Bipolar 1 disorder (HCC)   . Hearing voices     Past Surgical History:  Procedure Laterality Date  . EYE SURGERY    . VENTRICULOPERITONEAL SHUNT      Family History: No family history on file.  Social History:  reports that she has never smoked. She has never used smokeless tobacco. She reports that she drinks alcohol. She reports that she does not use drugs.  Additional Social History:  Alcohol / Drug Use Pain Medications: See MAR Prescriptions: See MAR Over the Counter: See MAR History of alcohol / drug use?: Yes Substance #  1 Name of Substance 1: Cigarettes 1 - Age of First Use: UTA 1 - Amount (size/oz): Pt reported smoknig a "billon" cigarettes daily. Pt's reported, the pt smokes about six cigarettes daily.  1 - Frequency: UTA 1 - Duration: UTA 1 - Last Use /  Amount: Pt reported smoknig a "billon" cigarettes daily. Pt's reported, the pt smokes about six cigarettes daily.   CIWA: CIWA-Ar BP: 123/89 Pulse Rate: 73 COWS:    PATIENT STRENGTHS: (choose at least two) Average or above average intelligence Supportive family/friends  Allergies: No Known Allergies  Home Medications:  (Not in a hospital admission)  OB/GYN Status:  No LMP recorded.  General Farrell Data Location of Farrell: [place] Far[facility]: In system Is this a Tele or Face-to-Face Farrell?: Face-to-Face Is this an Initial Farrell or a Re-Farrell for this encounter?: Initial Farrell Marital status: Single Maiden name: NA Is patient pregnant?: No Pregnancy Status: No Living Arrangements: Parent, Other relatives Can pt return to current living arrangement?: No Admission Status: Voluntary Is patient capable of signing voluntary admission?: Yes Referral Source: Self/Family/Friend Insurance type: BCBS     Crisis Care Plan Living Arrangements: Parent, Other relatives Legal Guardian: Other: (Self) Name of Psychiatrist: Dr. 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 : No Suicidal Intent: No Has patient had any suicidal intent within the past 6 months prior to admission? : No Is patient at risk for suicide?: No Suicidal Plan?: No Has patient had any suicidal plan within the past 6 months prior to admission? : No Access to Means: No What has been your use of drugs/alcohol within the last 12 months?: Cigarettes Previous Attempts/Gestures: No How many  times?: 0 Other Self Harm Risks: NA Triggers for Past Attempts: None known Intentional Self Injurious Behavior: None (Pt denies.) Family Suicide History: Unable to assess Recent stressful life event(s): Job Loss Persecutory voices/beliefs?: No Depression: Yes Depression Symptoms: Tearfulness, Feeling angry/irritable, Isolating Substance abuse history and/or treatment for substance abuse?: No Suicide prevention information given to non-admitted patients: Not applicable  Risk to Others within the past 6 months Homicidal Ideation: No (Pt denies.) Does patient have any lifetime risk of violence toward others beyond the six months prior to admission? : No Thoughts of Harm to Others: No Current Homicidal Intent: No Current Homicidal Plan: No Access to Homicidal Means: No Identified Victim: NA History of harm to others?: No Farrell of Violence: None Noted Violent Behavior Description: NA Does patient have access to weapons?: No Criminal Charges Pending?: No Does patient have a court date: No Is patient on probation?: No  Psychosis Hallucinations: Auditory, Olfactory Delusions: Unspecified  Mental Status Report Appearance/Hygiene: In scrubs Eye Contact: Fair Motor Activity: Unremarkable Speech: Word salad Level of Consciousness: Quiet/awake Mood: Labile Affect: Flat Anxiety Level: Minimal Thought Processes: Circumstantial, Tangential, Irrelevant Judgement: Impaired Orientation: Other (Comment) (date, year, city and state.) Obsessive Compulsive Thoughts/Behaviors: Unable to Assess  Cognitive Functioning Concentration: Fair Memory: Recent Impaired IQ: Average Insight: Poor Impulse Control: Poor Appetite: Poor Weight Loss: 0 Weight Gain: 0 Sleep:  (Pt's mother reported, it depends on the day.) Total Hours  of Sleep:  (Pt's mother reported, it depends on the day.) Vegetative Symptoms: Unable to Assess  ADLScreening [facility]) Patient's cognitive  ability adequate to safely complete daily activities?: Yes Patient able to express need for assistance with ADLs?: Yes Independently performs ADLs?: Yes (appropriate for developmental age)  Prior Inpatient Therapy Prior Inpatient Therapy: Yes Prior Therapy Dates: April 2017 Prior Therapy Facilty/Provider([place]. Reason for Treatment: altered mental status.   Prior Outpatient Therapy Prior Outpatient Therapy: Yes Prior Therapy Dates: Current Prior Therapy Facilty/Provider(s): [facility] Reason for Treatment: medication management Does patient have an ACCT team?: No Does patient have Intensive In-House Services?  : No Does patient have [place] Farrell services? : No Does patient have P4CC services?: No  ADL Screening (condition at time of admission) Patient's cognitive ability adequate to safely complete daily activities?: Yes Is the patient deaf or have difficulty hearing?: No Does the patient have difficulty seeing, even when wearing glasses/contacts?: No Does the patient have difficulty concentrating, remembering, or making decisions?: Yes (Pt has difficulty concentrating.) Patient able to express need for assistance with ADLs?: Yes Does the patient have difficulty dressing or bathing?: No Independently performs ADLs?: Yes (appropriate for developmental age) Does the patient have difficulty walking or climbing stairs?: No Weakness of Legs: None Weakness of Arms/Hands: None       Abuse/Neglect Farrell (Farrell to be complete while patient is alone) Physical Abuse: Denies (Pt denies.) Verbal Abuse: Denies (Pt denies. ) Sexual Abuse: Denies (Pt denies. )     Advance Directives (For Healthcare) Does Patient Have a Medical Advance Directive?: No    Additional Information 1:1 In Past 12 Months?: No CIRT Risk: No Elopement Risk: No Does patient have medical clearance?: Yes     Disposition:  Leslie SievertSpencer Simon, PA recommends inpatient treatment. Per Delorise Jacksonori, [facility]  no appropriate beds available. Disposition discussed with Dr. Denton LankSteinl and Carollee HerterShannon, RN.  Disposition Initial Farrell Completed for this Encounter: Yes Disposition of Patient: Inpatient treatment program Type of inpatient treatment program: Adult  Gwinda Passereylese D Bennett 09/02/2016 10:05 PM   Gwinda Passereylese D Bennett, MS, [facility], [facility] Triage Specialist [phone]

## 2016-09-02 NOTE — ED Notes (Signed)
Dr. Jeraldine LootsLockwood notified of pt wanting medication for sleep.

## 2016-09-02 NOTE — ED Notes (Signed)
Pt transferred from TCU presents with auditory hallucinations and off med's x 4 days.  Pt also reports she is forgetful.  Denies SI or HI.  Reports she occasionally feels hopeless.  Awake, alert & responsive, no distress noted, calm & cooperative at present.  Monitoring for safety, Q 15 min checks in effect.  Safety, check for contraband completed, no items found.

## 2016-09-02 NOTE — ED Provider Notes (Signed)
[facility]-EMERGENCY DEPT Provider Note   CSN: [id number] Arrival date & time: 09/02/16  1558     History   Chief Complaint Chief Complaint  Patient presents with  . Medical Clearance    HPI Leslie Farrell is a 30 y.o. female.  Patient with hx schizoaffective disorder, presents w parent, who indicates in past 1-2 weeks has noticed a marked decline in patients behavior.  She indicates pt may have stopped taking her meds. Has been having odd and paranoid behavior, talking to others who arent there, repetitively hearing voices talking about war strategies, having mood swings, decreased appetite.   Patient is not verbally responding to questions, and seems to be responding to internal stimuli - level 5 caveat.    The history is provided by the patient and a parent. The history is limited by the condition of the patient.    Past Medical History:  Diagnosis Date  . Bipolar 1 disorder (HCC)   . Hearing voices     There are no active problems to display for this patient.   Past Surgical History:  Procedure Laterality Date  . EYE SURGERY    . VENTRICULOPERITONEAL SHUNT      OB History    No data available       Home Medications    Prior to Admission medications   Medication Sig Start Date End Date Taking? Authorizing Provider  amoxicillin-clavulanate (AUGMENTIN) 875-125 MG tablet Take 1 tablet by mouth every 12 (twelve) hours. 02/14/16   Barrett HenleNicole Elizabeth Nadeau, PA-C  citalopram (CELEXA) 20 MG tablet TK 1 T PO  QAM 02/06/16   Historical Provider, MD  HYDROcodone-acetaminophen (NORCO/VICODIN) 5-325 MG tablet Take 1 tablet by mouth every 4 (four) hours as needed. 02/14/16   Barrett HenleNicole Elizabeth Nadeau, PA-C  ibuprofen (ADVIL,MOTRIN) 600 MG tablet Take 1 tablet (600 mg total) by mouth every 6 (six) hours as needed. 02/14/16   Barrett HenleNicole Elizabeth Nadeau, PA-C  methocarbamol (ROBAXIN) 500 MG tablet Take 2 tablets (1,000 mg total) by mouth every 8 (eight) hours as needed for muscle spasms. 03/07/16    Loren Raceravid Yelverton, MD  OLANZapine (ZYPREXA) 10 MG tablet TK ONE T PO Q NIGHT 02/06/16   Historical Provider, MD  traMADol (ULTRAM) 50 MG tablet Take 1 tablet (50 mg total) by mouth every 6 (six) hours as needed. 03/07/16   Loren Raceravid Yelverton, MD    Family History No family history on file.  Social History Social History  Substance Use Topics  . Smoking status: Never Smoker  . Smokeless tobacco: Never Used  . Alcohol use Yes     Comment: 1x week     Allergies   Patient has no known allergies.   Review of Systems Review of Systems  Unable to perform ROS: Psychiatric disorder  level 5 caveat, pt not verbally responsive.    Physical Exam Updated Vital Signs BP 136/82 (BP Location: Right Arm)   Pulse 92   Temp 98.6 F (37 C) (Oral)   Resp 18   SpO2 100%   Physical Exam  Constitutional: She appears well-developed and well-nourished. No distress.  HENT:  Head: Atraumatic.  Eyes: Conjunctivae are normal. Pupils are equal, round, and reactive to light. No scleral icterus.  Neck: Neck supple. No tracheal deviation present.  Cardiovascular: Normal rate, regular rhythm, normal heart sounds and intact distal pulses.   Pulmonary/Chest: Effort normal and breath sounds normal. No respiratory distress.  Abdominal: Normal appearance. She exhibits no distension. There is no tenderness.  Musculoskeletal: She exhibits no  edema.  Neurological: She is alert.  Skin: Skin is warm and dry. No rash noted. She is not diaphoretic.  Psychiatric:  Withdrawn, does not respond verbally, appears to be responding to internal stimuli.   Nursing note and vitals reviewed.    ED Treatments / Results  Labs (all labs ordered are listed, but only abnormal results are displayed) Labs Reviewed  RAPID URINE DRUG SCREEN, HOSP PERFORMED  COMPREHENSIVE METABOLIC PANEL  ETHANOL  SALICYLATE LEVEL  ACETAMINOPHEN LEVEL  CBC  POC URINE PREG, ED    EKG  EKG Interpretation None       Radiology No results  found.  Procedures Procedures (including critical care time)  Medications Ordered in ED Medications  acetaminophen (TYLENOL) tablet 650 mg (not administered)  LORazepam (ATIVAN) tablet 1 mg (not administered)     Initial Impression / Assessment and Plan / ED Course  I have reviewed the triage vital signs and the nursing notes.  Pertinent labs & imaging results that were available during my care of the patient were reviewed by me and considered in my medical decision making (see chart for details).  Clinical Course     Labs sent.  BH team consulted.  Anticipate patient will require inpatient psychiatric treatment.   Reviewed nursing notes and prior charts for additional history.   Mother indicates current symptoms very similar to when patient admitted 11/2015 with psychosis.   [facility]     Final Clinical Impressions(s) / ED Diagnoses   Final diagnoses:  None    New Prescriptions New Prescriptions   No medications on file     Cathren LaineKevin Katye Valek, MD 09/02/16 1920

## 2016-09-02 NOTE — ED Notes (Signed)
Writer and EMT Marilynne Halsted([place]) attempted to get blood, unsuccessful attempt 2X

## 2016-09-03 ENCOUNTER — Inpatient Hospital Stay (HOSPITAL_COMMUNITY)
Admission: AD | Admit: 2016-09-03 | Discharge: 2016-09-08 | DRG: 885 | Disposition: A | Discharge status: Home / Self Care | Payer: BLUE CROSS/BLUE SHIELD | Source: Intra-hospital | Attending: Psychiatry | Admitting: Psychiatry

## 2016-09-03 DIAGNOSIS — F314 Bipolar disorder, current episode depressed, severe, without psychotic features: Secondary | ICD-10-CM | POA: Diagnosis present

## 2016-09-03 DIAGNOSIS — F250 Schizoaffective disorder, bipolar type: Secondary | ICD-10-CM | POA: Diagnosis present

## 2016-09-03 DIAGNOSIS — F4310 Post-traumatic stress disorder, unspecified: Secondary | ICD-10-CM | POA: Diagnosis present

## 2016-09-03 DIAGNOSIS — F1010 Alcohol abuse, uncomplicated: Secondary | ICD-10-CM | POA: Clinically undetermined

## 2016-09-03 DIAGNOSIS — Z9889 Other specified postprocedural states: Secondary | ICD-10-CM | POA: Diagnosis not present

## 2016-09-03 DIAGNOSIS — F315 Bipolar disorder, current episode depressed, severe, with psychotic features: Secondary | ICD-10-CM | POA: Diagnosis not present

## 2016-09-03 DIAGNOSIS — F17210 Nicotine dependence, cigarettes, uncomplicated: Secondary | ICD-10-CM | POA: Diagnosis not present

## 2016-09-03 DIAGNOSIS — Z79899 Other long term (current) drug therapy: Secondary | ICD-10-CM | POA: Diagnosis not present

## 2016-09-03 DIAGNOSIS — Z982 Presence of cerebrospinal fluid drainage device: Secondary | ICD-10-CM

## 2016-09-03 DIAGNOSIS — F17200 Nicotine dependence, unspecified, uncomplicated: Secondary | ICD-10-CM | POA: Clinically undetermined

## 2016-09-03 MED ORDER — CITALOPRAM HYDROBROMIDE 20 MG PO TABS
20.0000 mg | ORAL_TABLET | Freq: Every day | ORAL | Status: DC
  Administered 2016-09-04 – 2016-09-08 (×5): 20 mg via ORAL
  Filled 2016-09-03 (×7): qty 1

## 2016-09-03 MED ORDER — ADULT MULTIVITAMIN W/MINERALS CH
1.0000 | ORAL_TABLET | Freq: Every day | ORAL | Status: DC
  Administered 2016-09-04 – 2016-09-08 (×5): 1 via ORAL
  Filled 2016-09-03 (×7): qty 1

## 2016-09-03 MED ORDER — TRAZODONE HCL 100 MG PO TABS
100.0000 mg | ORAL_TABLET | Freq: Every day | ORAL | Status: DC
  Administered 2016-09-05 – 2016-09-07 (×3): 100 mg via ORAL
  Filled 2016-09-03 (×5): qty 1

## 2016-09-03 MED ORDER — LORAZEPAM 1 MG PO TABS: 1.0000 mg | ORAL_TABLET | Freq: Four times a day (QID) | ORAL | Status: DC | PRN

## 2016-09-03 MED ORDER — HYDROXYZINE HCL 25 MG PO TABS: 25.0000 mg | ORAL_TABLET | Freq: Four times a day (QID) | ORAL | Status: AC | PRN

## 2016-09-03 MED ORDER — ONDANSETRON 4 MG PO TBDP
4.0000 mg | ORAL_TABLET | Freq: Four times a day (QID) | ORAL | Status: AC | PRN
  Administered 2016-09-03: 4 mg via ORAL
  Filled 2016-09-03: qty 1

## 2016-09-03 MED ORDER — LOPERAMIDE HCL 2 MG PO CAPS
2.0000 mg | ORAL_CAPSULE | ORAL | Status: AC | PRN
  Administered 2016-09-03: 4 mg via ORAL
  Filled 2016-09-03: qty 2

## 2016-09-03 MED ORDER — TRAZODONE HCL 100 MG PO TABS
100.0000 mg | ORAL_TABLET | Freq: Every day | ORAL | Status: DC
  Administered 2016-09-03: 100 mg via ORAL
  Filled 2016-09-03: qty 1

## 2016-09-03 MED ORDER — OLANZAPINE 10 MG PO TABS
10.0000 mg | ORAL_TABLET | Freq: Two times a day (BID) | ORAL | Status: DC
  Administered 2016-09-04: 10 mg via ORAL
  Filled 2016-09-03 (×3): qty 1

## 2016-09-03 MED ORDER — OLANZAPINE 10 MG PO TABS
10.0000 mg | ORAL_TABLET | Freq: Two times a day (BID) | ORAL | Status: DC
  Administered 2016-09-03: 10 mg via ORAL
  Filled 2016-09-03: qty 1

## 2016-09-03 MED ORDER — MAGNESIUM HYDROXIDE 400 MG/5ML PO SUSP: 30.0000 mL | Freq: Every day | ORAL | Status: DC | PRN

## 2016-09-03 MED ORDER — ALUM & MAG HYDROXIDE-SIMETH 200-200-20 MG/5ML PO SUSP: 30.0000 mL | ORAL | Status: DC | PRN

## 2016-09-03 NOTE — Progress Notes (Signed)
09/03/16 1359:  LRT went to pt room to offer activities, pt was sleep.  Caroll RancherMarjette Cyerra Yim, LRT/CTRS

## 2016-09-03 NOTE — BH Assessment (Signed)
[facility] Assessment Progress Note  Per Nelly RoutArchana Kumar, MD, this pt requires psychiatric hospitalization at this time.  Leslie AbedLindsay, RN, [facility].  She will call later to finalize these arrangements, but she advises this writer to have pt sign consent forms.  Pt has signed Voluntary Admission and Consent for Treatment, as well as Consent to Release Information to [facility] and to her parents, and a notification call has been placed to the former.  Signed forms [facility].  Pt's nurse, Rudean HittDawnaly, has been notified, and agrees to send original paperwork along with pt via [facility], and to call report to [phone] when the time comes.  Doylene Canninghomas Veida Spira, MA Triage Specialist [phone]

## 2016-09-03 NOTE — ED Notes (Signed)
Spoke with Hassie BruceAC Kim, [facility].  Reports pt admission on hold at present until further notice.  Will call to update.

## 2016-09-03 NOTE — Progress Notes (Addendum)
Introduced self to pt.  Pt is pale and reports feeling "faint."  Pt reports she has had diarrhea and has been "vomiting" and this started "when I got over here."  Pt reports she does drink alcohol and she occasionally gets "drunk."  Pt reports she occasionally gets "shakes."  Reports last drink was "a few days ago."  Pt reports she does not drink more than a bottle of wine when she does drink.  On-site provider notified of above.  PRN medication for nausea/vomiting and loose stool was ordered and administered.  Pt verbally contracts for safety.  She ate half a sandwich, a cup of pudding, and most of a bag of chips.  Pt provided with ginger ale.  She reports she will inform staff of needs and concerns.  She denies SI/HI, denies hallucinations, denies pain.  Pt is currently in quiet room.  Will continue to monitor and assess.

## 2016-09-03 NOTE — ED Notes (Signed)
Pt awake, alert & responsive, no distress noted, calm & cooperative at present.  Visitor at bedside.  [facility] and [facility].  Monitoring for safety, Q 15 min checks in effect.

## 2016-09-03 NOTE — ED Notes (Signed)
Report called to RN Otto Herbreka, [facility], Pending [facility] transport.

## 2016-09-03 NOTE — ED Notes (Signed)
Pt taking a shower at present. 

## 2016-09-03 NOTE — Consult Note (Signed)
[facility] Consult   Reason for Consult:  Suicidal ideations with delusions Referring Physician:  EDP Patient Identification: Leslie Farrell MRN:  [id number] Principal Diagnosis: Bipolar affective disorder, depressed, severe, with psychotic behavior ([place]) Diagnosis:   Patient Active Problem List   Diagnosis Date Noted  . Bipolar affective disorder, depressed, severe, with psychotic behavior ([place]) [F31.5] 09/03/2016    Priority: High    Total Time spent with patient: 45 minutes  Subjective:   Leslie Farrell is a 30 y.o. female patient reports being here for "medicine changing."  HPI:  30 yo female who presented to the ED with suicidal ideations and delusions that someone was trying to kill her baby.  She complains of feeling manic but obviously depressed, does report insomnia and "talking out loud" yesterday.  Quiet, soft voice, cover over her head initially.  Calm and cooperative.  Denies homicidal and suicidal ideations but responding to internal stimuli with paranoid tendencies today.  Not taking her medications consistently.  Past Psychiatric History: bipolar disorder  Risk to Self: Suicidal Ideation: No-Not Currently/Within Last 6 Months (Pt reported, to her mother her dad always wants her kill her) Suicidal Intent: No Is patient at risk for suicide?: No Suicidal Plan?: No Access to Means: No What has been your use of drugs/alcohol within the last 12 months?: Cigarettes How many times?: 0 Other Self Harm Risks: NA Triggers for Past Attempts: None known Intentional Self Injurious Behavior: None (Pt denies.) Risk to Others: Homicidal Ideation: No (Pt denies.) Thoughts of Harm to Others: No Current Homicidal Intent: No Current Homicidal Plan: No Access to Homicidal Means: No Identified Victim: NA History of harm to others?: No Assessment of Violence: None Noted Violent Behavior Description: NA Does patient have access to weapons?: No Criminal Charges Pending?:  No Does patient have a court date: No Prior Inpatient Therapy: Prior Inpatient Therapy: Yes Prior Therapy Dates: April 2017 Prior Therapy Facilty/Provider(s): Place in [place], [place]. Reason for Treatment: altered mental status.  Prior Outpatient Therapy: Prior Outpatient Therapy: Yes Prior Therapy Dates: Current Prior Therapy Facilty/Provider(s): Dr. Melina Copa Reason for Treatment: medication management Does patient have an ACCT team?: No Does patient have Intensive In-House Services?  : No Does patient have [facility] services? : No Does patient have P4[facility] services?: No  Past Medical History:  Past Medical History:  Diagnosis Date  . Bipolar 1 disorder ([place])   . Hearing voices     Past Surgical History:  Procedure Laterality Date  . EYE SURGERY    . VENTRICULOPERITONEAL SHUNT     Family History: No family history on file. Family Psychiatric  History: none Social History:  History  Alcohol Use  . Yes    Comment: 1x week     History  Drug Use No    Social History   Social History  . Marital status: Single    Spouse name: N/A  . Number of children: N/A  . Years of education: N/A   Social History Main Topics  . Smoking status: Never Smoker  . Smokeless tobacco: Never Used  . Alcohol use Yes     Comment: 1x week  . Drug use: No  . Sexual activity: Not Asked   Other Topics Concern  . None   Social History Narrative  . None   Additional Social History:    Allergies:  No Known Allergies  Labs:  Results for orders placed or performed during the hospital encounter of 09/02/16 (from the past 48 hour(s))  Rapid urine drug  screen (hospital performed)     Status: None   Collection Time: 09/02/16  5:13 PM  Result Value Ref Range   Opiates NONE DETECTED NONE DETECTED   Cocaine NONE DETECTED NONE DETECTED   Benzodiazepines NONE DETECTED NONE DETECTED   Amphetamines NONE DETECTED NONE DETECTED   Tetrahydrocannabinol NONE DETECTED NONE DETECTED   Barbiturates NONE  DETECTED NONE DETECTED    Comment:        DRUG SCREEN FOR MEDICAL PURPOSES ONLY.  IF CONFIRMATION IS NEEDED FOR ANY PURPOSE, NOTIFY LAB WITHIN 5 DAYS.        LOWEST DETECTABLE LIMITS FOR URINE DRUG SCREEN Drug Class       Cutoff (ng/mL) Amphetamine      1000 Barbiturate      200 Benzodiazepine   665 Tricyclics       993 Opiates          300 Cocaine          300 THC              50   POC urine preg, ED     Status: None   Collection Time: 09/02/16  5:20 PM  Result Value Ref Range   Preg Test, Ur NEGATIVE NEGATIVE    Comment:        THE SENSITIVITY OF THIS METHODOLOGY IS >24 mIU/mL   Comprehensive metabolic panel     Status: Abnormal   Collection Time: 09/02/16  9:05 PM  Result Value Ref Range   Sodium 141 135 - 145 mmol/L   Potassium 3.7 3.5 - 5.1 mmol/L   Chloride 106 101 - 111 mmol/L   CO2 27 22 - 32 mmol/L   Glucose, Bld 141 (H) 65 - 99 mg/dL   BUN 8 6 - 20 mg/dL   Creatinine, Ser 0.74 0.44 - 1.00 mg/dL   Calcium 9.2 8.9 - 10.3 mg/dL   Total Protein 7.0 6.5 - 8.1 g/dL   Albumin 4.6 3.5 - 5.0 g/dL   AST 23 15 - 41 U/L   ALT 15 14 - 54 U/L   Alkaline Phosphatase 66 38 - 126 U/L   Total Bilirubin 0.8 0.3 - 1.2 mg/dL   GFR calc non Af Amer >60 >60 mL/min   GFR calc Af Amer >60 >60 mL/min    Comment: (NOTE) The eGFR has been calculated using the CKD EPI equation. This calculation has not been validated in all clinical situations. eGFR's persistently <60 mL/min signify possible Chronic Kidney Disease.    Anion gap 8 5 - 15  Ethanol     Status: None   Collection Time: 09/02/16  9:05 PM  Result Value Ref Range   Alcohol, Ethyl (B) <5 <5 mg/dL    Comment:        LOWEST DETECTABLE LIMIT FOR SERUM ALCOHOL IS 5 mg/dL FOR MEDICAL PURPOSES ONLY   Salicylate level     Status: None   Collection Time: 09/02/16  9:05 PM  Result Value Ref Range   Salicylate Lvl <5.7 2.8 - 30.0 mg/dL  Acetaminophen level     Status: Abnormal   Collection Time: 09/02/16  9:05 PM  Result  Value Ref Range   Acetaminophen (Tylenol), Serum <10 (L) 10 - 30 ug/mL    Comment:        THERAPEUTIC CONCENTRATIONS VARY SIGNIFICANTLY. A RANGE OF 10-30 ug/mL MAY BE AN EFFECTIVE CONCENTRATION FOR MANY PATIENTS. HOWEVER, SOME ARE BEST TREATED AT CONCENTRATIONS OUTSIDE THIS RANGE. ACETAMINOPHEN CONCENTRATIONS >150 ug/mL AT 4  HOURS AFTER INGESTION AND >50 ug/mL AT 12 HOURS AFTER INGESTION ARE OFTEN ASSOCIATED WITH TOXIC REACTIONS.   cbc     Status: None   Collection Time: 09/02/16  9:05 PM  Result Value Ref Range   WBC 9.3 4.0 - 10.5 K/uL   RBC 4.71 3.87 - 5.11 MIL/uL   Hemoglobin 14.1 12.0 - 15.0 g/dL   HCT 39.8 36.0 - 46.0 %   MCV 84.5 78.0 - 100.0 fL   MCH 29.9 26.0 - 34.0 pg   MCHC 35.4 30.0 - 36.0 g/dL   RDW 12.1 11.5 - 15.5 %   Platelets 282 150 - 400 K/uL    Current Facility-Administered Medications  Medication Dose Route Frequency Provider Last Rate Last Dose  . acetaminophen (TYLENOL) tablet 650 mg  650 mg Oral Q4H PRN Lajean Saver, MD      . citalopram (CELEXA) tablet 20 mg  20 mg Oral Daily Lajean Saver, MD   20 mg at 09/03/16 1102  . OLANZapine (ZYPREXA) tablet 10 mg  10 mg Oral QHS Lajean Saver, MD   10 mg at 09/02/16 2237   Current Outpatient Prescriptions  Medication Sig Dispense Refill  . acetaminophen (TYLENOL) 325 MG tablet Take 650 mg by mouth every 6 (six) hours as needed.    . citalopram (CELEXA) 20 MG tablet take 48m by mouth every monring  0  . ibuprofen (ADVIL,MOTRIN) 600 MG tablet Take 1 tablet (600 mg total) by mouth every 6 (six) hours as needed. 30 tablet 0  . QUEtiapine (SEROQUEL) 300 MG tablet Take 150 mg by mouth at bedtime.  0  . amoxicillin-clavulanate (AUGMENTIN) 875-125 MG tablet Take 1 tablet by mouth every 12 (twelve) hours. (Patient not taking: Reported on 09/02/2016) 14 tablet 0  . HYDROcodone-acetaminophen (NORCO/VICODIN) 5-325 MG tablet Take 1 tablet by mouth every 4 (four) hours as needed. (Patient not taking: Reported on  09/02/2016) 10 tablet 0  . methocarbamol (ROBAXIN) 500 MG tablet Take 2 tablets (1,000 mg total) by mouth every 8 (eight) hours as needed for muscle spasms. (Patient not taking: Reported on 09/02/2016) 30 tablet 0  . traMADol (ULTRAM) 50 MG tablet Take 1 tablet (50 mg total) by mouth every 6 (six) hours as needed. (Patient not taking: Reported on 09/02/2016) 10 tablet 0    Musculoskeletal: Strength & Muscle Tone: within normal limits Gait & Station: normal Patient leans: N/A  Psychiatric Specialty Exam: Physical Exam  Constitutional: She is oriented to person, place, and time. She appears well-developed and well-nourished.  HENT:  Head: Normocephalic.  Neck: Normal range of motion.  Respiratory: Effort normal.  Musculoskeletal: Normal range of motion.  Neurological: She is alert and oriented to person, place, and time.  Psychiatric: Her speech is normal. Judgment normal. She is actively hallucinating. Cognition and memory are normal. She exhibits a depressed mood. She expresses suicidal ideation. She expresses suicidal plans.    Review of Systems  Psychiatric/Behavioral: Positive for depression and suicidal ideas. The patient has insomnia.   All other systems reviewed and are negative.   Blood pressure 114/65, pulse 70, temperature 97.9 F (36.6 C), temperature source Oral, resp. rate 16, SpO2 100 %.There is no height or weight on file to calculate BMI.  General Appearance: Casual  Eye Contact:  Fair  Speech:  Normal Rate  Volume:  Decreased  Mood:  Depressed  Affect:  Congruent  Thought Process:  Coherent and Descriptions of Associations: Intact  Orientation:  Full (Time, Place, and Person)  Thought Content:  Delusions and Hallucinations: Auditory, paranoia  Suicidal Thoughts:  No  Homicidal Thoughts:  No  Memory:  Immediate;   Fair Recent;   Fair Remote;   Fair  Judgement:  Fair  Insight:  Fair  Psychomotor Activity:  Normal  Concentration:  Concentration: Fair and  Attention Span: Fair  Recall:  [employer] of Knowledge:  Fair  Language:  Fair  Akathisia:  No  Handed:  Right  AIMS (if indicated):     Assets:  Housing Leisure Time Physical Health Resilience Social Support  ADL's:  Intact  Cognition:  WNL  Sleep:        Treatment Plan Summary: Daily contact with patient to assess and evaluate symptoms and progress in treatment, Medication management and Plan bipolar affective disorder, depressed, severe with psychosis:  -Crisis stabilization -Medication management:  Continue Celexa 10 mg to 20 mg daily for  Depression and discontinue Seroquel 150 mg at bedtime.  Start Zyprexa 10 mg daily to BID for psychosis and Trazodone 100 mg at bedtime for sleep -Individual counseling  Disposition: Recommend psychiatric Inpatient admission when medically cleared.  Waylan Boga, NP 09/03/2016 3:02 PM

## 2016-09-03 NOTE — ED Notes (Signed)
Patient has been in her room in the bed most of the day.  She has not eaten, but did take her medications.  States she lives with her mother and she monitors her medications.  She also states she hasn't had her medicines in a few days.  She is a bit disorganized, but has been cooperative.

## 2016-09-04 ENCOUNTER — Encounter (HOSPITAL_COMMUNITY): Payer: Self-pay | Admitting: *Deleted

## 2016-09-04 DIAGNOSIS — F250 Schizoaffective disorder, bipolar type: Principal | ICD-10-CM

## 2016-09-04 DIAGNOSIS — F1010 Alcohol abuse, uncomplicated: Secondary | ICD-10-CM | POA: Clinically undetermined

## 2016-09-04 DIAGNOSIS — F4310 Post-traumatic stress disorder, unspecified: Secondary | ICD-10-CM | POA: Diagnosis present

## 2016-09-04 DIAGNOSIS — Z818 Family history of other mental and behavioral disorders: Secondary | ICD-10-CM

## 2016-09-04 DIAGNOSIS — F17200 Nicotine dependence, unspecified, uncomplicated: Secondary | ICD-10-CM | POA: Clinically undetermined

## 2016-09-04 DIAGNOSIS — Z79899 Other long term (current) drug therapy: Secondary | ICD-10-CM

## 2016-09-04 DIAGNOSIS — F17210 Nicotine dependence, cigarettes, uncomplicated: Secondary | ICD-10-CM

## 2016-09-04 MED ORDER — ARIPIPRAZOLE 5 MG PO TABS
5.0000 mg | ORAL_TABLET | Freq: Every evening | ORAL | Status: DC
  Administered 2016-09-04: 5 mg via ORAL
  Filled 2016-09-04 (×3): qty 1

## 2016-09-04 MED ORDER — QUETIAPINE FUMARATE 50 MG PO TABS
50.0000 mg | ORAL_TABLET | Freq: Three times a day (TID) | ORAL | Status: DC | PRN
Start: 2016-09-04 — End: 2016-09-08

## 2016-09-04 MED ORDER — LORAZEPAM 1 MG PO TABS: 1.0000 mg | ORAL_TABLET | Freq: Four times a day (QID) | ORAL | Status: DC | PRN

## 2016-09-04 MED ORDER — LORAZEPAM 2 MG/ML IJ SOLN: 1.0000 mg | Freq: Four times a day (QID) | INTRAMUSCULAR | Status: DC | PRN
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
## 2016-09-04 NOTE — [facility] Group Notes (Signed)
[facility] LCSW Group Therapy  09/04/2016 1:49 PM  Type of Therapy:  Group Therapy  Participation Level:  Minimal  Participation Quality:  Attentive  Affect:  Flat  Cognitive:  Alert   Insight:  Limited  Engagement in Therapy:  Limited  Modes of Intervention:  Activity, Discussion, Education, Socialization and Support  Summary of Progress/Problems: Chaplain facilitated group. Patients discussed care and what care means to them. Pt attended group and stayed the entire time. She chose a picture that reminded her of standing up for people.    Sempra EnergyCandace L Nandita Mathenia MSW, LCSWA  09/04/2016, 1:49 PM

## 2016-09-04 NOTE — Progress Notes (Signed)
Recreation Therapy Notes  Date: 09/04/16 Time: 1000 Location: 500 Hall Dayroom  Group Topic: Coping Skills  Goal Area(s) Addresses:  Patient will be able to identify positive coping skills. Patient will be able to identify benefits of coping skills. Patient will be able to identify benefits of using coping skills post d/c.  Intervention: Can with various coping skills in it, dry erase maker, dry erase board  Activity: Coping Skills Pictionary.  LRT had a can with various types of coping skills.  A patient would come to the board, pick a slip of paper from the can and draw whatever is on the paper on the board.  The remaining patients would attempt to guess what the patient is drawing.  The person that guesses correctly would go next.  Education: Coping Skills, Discharge Planning.   Education Outcome: Acknowledges understanding/In group clarification offered/Needs additional education.   Clinical Observations/Feedback: Pt did not attend group.   Kimiah Hibner, LRT/CTRS         Leslie Farrell A 09/04/2016 1:10 PM 

## 2016-09-04 NOTE — [facility] Suicide Risk Assessment (Signed)
[facility]   Nursing information obtained from:  Patient Demographic factors:  Caucasian Current Mental Status:  NA Loss Factors:  Decrease in vocational status, Decline in physical health, Financial problems / change in socioeconomic status Historical Factors:  Prior suicide attempts, Family history of suicide Risk Reduction Factors:  Living with another person, especially a relative, Positive social support  Total Time spent with patient: 30 minutes Principal Problem: Schizoaffective disorder, bipolar type ([facility]) Diagnosis:   Patient Active Problem List   Diagnosis Date Noted  . Schizoaffective disorder, bipolar type ([facility]) [F25.0] 09/04/2016  . PTSD (post-traumatic stress disorder) [F43.10] 09/04/2016   Subjective Data:Please see H&P.   Continued Clinical Symptoms:    The "Alcohol Use Disorders Identification Test", Guidelines for Use in Primary Care, Second Edition.  World Science writerHealth Organization [facility](WHO). Score between 0-7:  no or low risk or alcohol related problems. Score between 8-15:  moderate risk of alcohol related problems. Score between 16-19:  high risk of alcohol related problems. Score 20 or above:  warrants further diagnostic evaluation for alcohol dependence and treatment.   CLINICAL FACTORS:   Severe Anxiety and/or Agitation Currently Psychotic Unstable or Poor Therapeutic Relationship Previous Psychiatric Diagnoses and Treatments   Musculoskeletal: Strength & Muscle Tone: within normal limits Gait & Station: normal Patient leans: N/A  Psychiatric Specialty Exam: Physical Exam  ROS  Blood pressure 94/80, pulse 78, temperature 98 F (36.7 C), temperature source Oral, resp. rate 20, height 5' 6.25" (1.683 m), weight 58.1 kg (128 lb).Body mass index is 20.5 kg/m.                Please see H&P.                                           COGNITIVE FEATURES THAT CONTRIBUTE TO RISK:  Closed-mindedness,  Polarized thinking and Thought constriction (tunnel vision)    SUICIDE RISK:   Moderate:  Frequent suicidal ideation with limited intensity, and duration, some specificity in terms of plans, no associated intent, good self-control, limited dysphoria/symptomatology, some risk factors present, and identifiable protective factors, including available and accessible social support.   PLAN OF CARE: Please see H&P.   I certify that inpatient services furnished can reasonably be expected to improve the patient's condition.  Elinor Kleine, MD 09/04/2016, 11:08 AM

## 2016-09-04 NOTE — Progress Notes (Signed)
Adult Psychoeducational Group Note  Date:  09/04/2016 Time:  8:38 PM  Group Topic/Focus:  Wrap-Up Group:   The focus of this group is to help patients review their daily goal of treatment and discuss progress on daily workbooks.   Participation Level:  Active  Participation Quality:  Appropriate  Affect:  Appropriate  Cognitive:  Appropriate  Insight: Appropriate  Engagement in Group:  Engaged  Modes of Intervention:  Discussion  Additional Comments: The patient expressed that she attend group.The patient also said that she rates today a 8. Octavio Mannshigpen, Davielle Lingelbach Lee 09/04/2016, 8:38 PM

## 2016-09-04 NOTE — Progress Notes (Signed)
Recreation Therapy Notes  INPATIENT RECREATION THERAPY ASSESSMENT  Patient Details Name: Leslie Farrell MRN: [id number] DOB: Feb 05, 1986 Today's Date: 09/04/2016  Patient Stressors: Work, Other (Comment) (Lack of sleep; finances)  Pt stated she was here because medicine dosage changed, became paranoid and forgetful and lost job.  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

## 2016-09-04 NOTE — Tx Team (Signed)
Interdisciplinary Treatment and Diagnostic Plan Update  09/04/2016 Time of Session: 9:00am  Leslie LawsKerri Farrell MRN: [id number]  Principal Diagnosis: Schizoaffective disorder, bipolar type (HCC)  Secondary Diagnoses: Principal Problem:   Schizoaffective disorder, bipolar type (HCC) Active Problems:   PTSD (post-traumatic stress disorder)   Tobacco use disorder   Alcohol use disorder, mild, abuse   Current Medications:  Current Facility-Administered Medications  Medication Dose Route Frequency Provider Last Rate Last Dose  . alum & mag hydroxide-simeth (MAALOX/MYLANTA) 200-200-20 MG/5ML suspension 30 mL  30 mL Oral Q4H PRN Charm RingsJamison Y Lord, NP      . ARIPiprazole (ABILIFY) tablet 5 mg  5 mg Oral QPM Saramma Eappen, MD      . citalopram (CELEXA) tablet 20 mg  20 mg Oral Daily Charm RingsJamison Y Lord, NP   20 mg at 09/04/16 0920  . hydrOXYzine (ATARAX/VISTARIL) tablet 25 mg  25 mg Oral Q6H PRN Jackelyn PolingJason A Berry, NP      . loperamide (IMODIUM) capsule 2-4 mg  2-4 mg Oral PRN Jackelyn PolingJason A Berry, NP   4 mg at 09/03/16 2337  . LORazepam (ATIVAN) tablet 1 mg  1 mg Oral Q6H PRN Jomarie LongsSaramma Eappen, MD       Or  . LORazepam (ATIVAN) injection 1 mg  1 mg Intramuscular Q6H PRN Saramma Eappen, MD      . magnesium hydroxide (MILK OF MAGNESIA) suspension 30 mL  30 mL Oral Daily PRN Charm RingsJamison Y Lord, NP      . multivitamin with minerals tablet 1 tablet  1 tablet Oral Daily Jackelyn PolingJason A Berry, NP   1 tablet at 09/04/16 0921  . ondansetron (ZOFRAN-ODT) disintegrating tablet 4 mg  4 mg Oral Q6H PRN Jackelyn PolingJason A Berry, NP   4 mg at 09/03/16 2336  . QUEtiapine (SEROQUEL) tablet 50 mg  50 mg Oral TID PRN Jomarie LongsSaramma Eappen, MD      . traZODone (DESYREL) tablet 100 mg  100 mg Oral QHS Charm RingsJamison Y Lord, NP       PTA Medications: Prescriptions Prior to Admission  Medication Sig Dispense Refill Last Dose  . acetaminophen (TYLENOL) 325 MG tablet Take 650 mg by mouth every 6 (six) hours as needed.   Past Month at Unknown time  . citalopram (CELEXA) 20 MG  tablet take 20mg  by mouth every monring  0 09/01/2016 at Unknown time  . ibuprofen (ADVIL,MOTRIN) 600 MG tablet Take 1 tablet (600 mg total) by mouth every 6 (six) hours as needed. 30 tablet 0 Past Week at Unknown time  . QUEtiapine (SEROQUEL) 300 MG tablet Take 150 mg by mouth at bedtime.  0 09/01/2016 at Unknown time  . amoxicillin-clavulanate (AUGMENTIN) 875-125 MG tablet Take 1 tablet by mouth every 12 (twelve) hours. (Patient not taking: Reported on 09/04/2016) 14 tablet 0 Not Taking at Unknown time  . methocarbamol (ROBAXIN) 500 MG tablet Take 2 tablets (1,000 mg total) by mouth every 8 (eight) hours as needed for muscle spasms. (Patient not taking: Reported on 09/04/2016) 30 tablet 0 Not Taking at Unknown time  . traMADol (ULTRAM) 50 MG tablet Take 1 tablet (50 mg total) by mouth every 6 (six) hours as needed. (Patient not taking: Reported on 09/04/2016) 10 tablet 0 Not Taking at Unknown time    Patient Stressors: Health problems Medication change or noncompliance  Patient Strengths: Manufacturing systems engineerCommunication skills Supportive family/friends  Treatment Modalities: Medication Management, Group therapy, Case management,  1 to 1 session with clinician, Psychoeducation, Recreational therapy.   Physician Treatment Plan for Primary Diagnosis:  Schizoaffective disorder, bipolar type (HCC) Long Term Goal(s): Improvement in symptoms so as ready for discharge Improvement in symptoms so as ready for discharge   Short Term Goals: Ability to verbalize feelings will improve Compliance with prescribed medications will improve Ability to identify changes in lifestyle to reduce recurrence of condition will improve Compliance with prescribed medications will improve  Medication Management: Evaluate patient's response, side effects, and tolerance of medication regimen.  Therapeutic Interventions: 1 to 1 sessions, Unit Group sessions and Medication administration.  Evaluation of Outcomes:  Progressing  Physician Treatment Plan for Secondary Diagnosis: Principal Problem:   Schizoaffective disorder, bipolar type (HCC) Active Problems:   PTSD (post-traumatic stress disorder)   Tobacco use disorder   Alcohol use disorder, mild, abuse  Long Term Goal(s): Improvement in symptoms so as ready for discharge Improvement in symptoms so as ready for discharge   Short Term Goals: Ability to verbalize feelings will improve Compliance with prescribed medications will improve Ability to identify changes in lifestyle to reduce recurrence of condition will improve Compliance with prescribed medications will improve     Medication Management: Evaluate patient's response, side effects, and tolerance of medication regimen.  Therapeutic Interventions: 1 to 1 sessions, Unit Group sessions and Medication administration.  Evaluation of Outcomes: Progressing   RN Treatment Plan for Primary Diagnosis: Schizoaffective disorder, bipolar type (HCC) Long Term Goal(s): Knowledge of disease and therapeutic regimen to maintain health will improve  Short Term Goals: Ability to verbalize frustration and anger appropriately will improve, Ability to participate in decision making will improve, Ability to verbalize feelings will improve, Ability to identify and develop effective coping behaviors will improve and Compliance with prescribed medications will improve  Medication Management: RN will administer medications as ordered by provider, will assess and evaluate patient's response and provide education to patient for prescribed medication. RN will report any adverse and/or side effects to prescribing provider.  Therapeutic Interventions: 1 on 1 counseling sessions, Psychoeducation, Medication administration, Evaluate responses to treatment, Monitor vital signs and CBGs as ordered, Perform/monitor CIWA, COWS, AIMS and Fall Risk screenings as ordered, Perform wound care treatments as ordered.  Evaluation  of Outcomes: Progressing   LCSW Treatment Plan for Primary Diagnosis: Schizoaffective disorder, bipolar type (HCC) Long Term Goal(s): Safe transition to appropriate next level of care at discharge, Engage patient in therapeutic group addressing interpersonal concerns.  Short Term Goals: Engage patient in aftercare planning with referrals and resources, Increase social support, Increase ability to appropriately verbalize feelings, Increase emotional regulation, Facilitate acceptance of mental health diagnosis and concerns, Identify triggers associated with mental health/substance abuse issues and Increase skills for wellness and recovery  Therapeutic Interventions: Assess for all discharge needs, 1 to 1 time with Social worker, Explore available resources and support systems, Assess for adequacy in community support network, Educate family and significant other(s) on suicide prevention, Complete Psychosocial Assessment, Interpersonal group therapy.  Evaluation of Outcomes: Progressing   Progress in Treatment: Attending groups: Yes. Participating in groups: Yes. Taking medication as prescribed: Yes. Toleration medication: Yes. Family/Significant other contact made: No, will contact:  CSW assessing  Patient understands diagnosis: Yes. Discussing patient identified problems/goals with staff: Yes. Medical problems stabilized or resolved: Yes. Denies suicidal/homicidal ideation: Yes. Issues/concerns per patient self-inventory: No. Other: NA  New problem(s) identified: No, Describe:  NA  New Short Term/Long Term Goal(s):  Discharge Plan or Barriers: Pt plans to return home and follow up with outpatient.    Reason for Continuation of Hospitalization: Hallucinations Medication stabilization Other; describe Paranoia  Estimated Length of Stay: 3-5 days   Attendees: Patient: 09/04/2016 1:37 PM  Physician: Jomarie Longs, MD  09/04/2016 1:37 PM  Nursing: Yong Channel RN  09/04/2016 1:37 PM   RN Care Manager: Victorino Dike, RN  09/04/2016 1:37 PM  Social Worker: Rondall Allegra, LCSWA  09/04/2016 1:37 PM  Recreational Therapist:  09/04/2016 1:37 PM  Other:  09/04/2016 1:37 PM  Other:  09/04/2016 1:37 PM  Other: 09/04/2016 1:37 PM    Scribe for Treatment Team: Rondall Allegra, LCSWA 09/04/2016 1:37 PM

## 2016-09-04 NOTE — Progress Notes (Signed)
Writer spoke with patient 1:1 and she reports having had a good day. She reports that she plans on getting another job and not drinking while on her medications. She reports that her mother is her support system and they have a good relationship. She was informed of her scheduled medication and reported that she would not need it tonight. Support and encouragement given , safety maintained on unit with 15 min checks.
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
## 2016-09-04 NOTE — [facility] Counselor (Signed)
CSW attempted PSA but patient would not wake for the assessment.   Vernie ShanksLauren Daleena Rotter, LCSW Clinical Social Work [phone]

## 2016-09-04 NOTE — Tx Team (Signed)
Initial Treatment Plan 09/04/2016 12:39 AM Leslie LawsKerri Linarez AVW:[id number]    PATIENT STRESSORS: Health problems Medication change or noncompliance   PATIENT STRENGTHS: Communication skills Supportive family/friends   PATIENT IDENTIFIED PROBLEMS: Psychosis  "Get right medication"  "Coping mechanisms for alcohol"                 DISCHARGE CRITERIA:  Ability to meet basic life and health needs Improved stabilization in mood, thinking, and/or behavior Motivation to continue treatment in a less acute level of care Need for constant or close observation no longer present Verbal commitment to aftercare and medication compliance Withdrawal symptoms are absent or subacute and managed without 24-hour nursing intervention  PRELIMINARY DISCHARGE PLAN: Outpatient therapy Return to previous living arrangement Return to previous work or school arrangements  PATIENT/FAMILY INVOLVEMENT: This treatment plan has been presented to and reviewed with the patient, Leslie Farrell.  The patient and family have been given the opportunity to ask questions and make suggestions.  Carleene OverlieMiddleton, [name], RN 09/04/2016, 12:39 AM

## 2016-09-05 MED ORDER — ARIPIPRAZOLE 10 MG PO TABS
10.0000 mg | ORAL_TABLET | Freq: Every evening | ORAL | Status: DC
Start: 2016-09-05 — End: 2016-09-08
  Administered 2016-09-05 – 2016-09-07 (×3): 10 mg via ORAL
  Filled 2016-09-05 (×4): qty 1
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
## 2016-09-05 NOTE — [facility] Group Notes (Signed)
[facility] Group Notes:  (Nursing/MHT/Case Management/Adjunct)  Date:  09/05/2016  Time:  10:17 AM  Type of Therapy:  Nurse Education  Participation Level:  Minimal  Participation Quality:  Minimal  Affect:  Blunted  Cognitive:  Appropriate  Insight:  Limited  Engagement in Group:  Limited  Modes of Intervention:  Discussion  Summary of Progress/Problems: Patient goals were to go home and work on Manufacturing systems engineercommunication skills  Leslie BergamoBarbara M Rajean Farrell 09/05/2016, 10:17 AM

## 2016-09-05 NOTE — Progress Notes (Signed)
Patient has been up and active on the unit, and has voiced no complaints. She is hopeful to discharge soon.  Patient currently denies having pain, -si/hi/a/v hall. Support and encouragement offered, safety maintained on unit, will continue to monitor.

## 2016-09-05 NOTE — Progress Notes (Signed)
[facility] MD Progress Note  09/05/2016 1:39 PM Jaleisa Brose  MRN:  [id number] Subjective: Patient states " I still have some anxiety and mood issues - but its better than before.'  Objective:Patient seen and chart reviewed.Discussed patient with treatment team.  Pt today seen as less labile , more appropriate . Pt reports AH as improved and her anxiety is getting better. Pt currently tolerating the new medication - Abilify - continue to uptitrate and plan to DC on abilify maintena IM.     Principal Problem: Schizoaffective disorder, bipolar type (HCC) Diagnosis:   Patient Active Problem List   Diagnosis Date Noted  . Schizoaffective disorder, bipolar type (HCC) [F25.0] 09/04/2016  . PTSD (post-traumatic stress disorder) [F43.10] 09/04/2016  . Tobacco use disorder [F17.200] 09/04/2016  . Alcohol use disorder, mild, abuse [F10.10] 09/04/2016   Total Time spent with patient: 20 minutes  Past Psychiatric History: Please see H&P.   Past Medical History:  Past Medical History:  Diagnosis Date  . Bipolar 1 disorder (HCC)   . Hearing voices     Past Surgical History:  Procedure Laterality Date  . EYE SURGERY    . VENTRICULOPERITONEAL SHUNT     Family History:  Family History  Problem Relation Age of Onset  . Mental retardation Brother   . Schizophrenia Cousin    Family Psychiatric  History: Please see H&P.  Social History:  History  Alcohol Use  . Yes    Comment: 1x week     History  Drug Use No    Social History   Social History  . Marital status: Single    Spouse name: N/A  . Number of children: N/A  . Years of education: N/A   Social History Main Topics  . Smoking status: Never Smoker  . Smokeless tobacco: Never Used  . Alcohol use Yes     Comment: 1x week  . Drug use: No  . Sexual activity: Not Asked   Other Topics Concern  . None   Social History Narrative  . None   Additional Social History:                         Sleep:  Fair  Appetite:  Fair  Current Medications: Current Facility-Administered Medications  Medication Dose Route Frequency Provider Last Rate Last Dose  . alum & mag hydroxide-simeth (MAALOX/MYLANTA) 200-200-20 MG/5ML suspension 30 mL  30 mL Oral Q4H PRN Charm Rings, NP      . ARIPiprazole (ABILIFY) tablet 5 mg  5 mg Oral QPM Jomarie Longs, MD   5 mg at 09/04/16 1716  . citalopram (CELEXA) tablet 20 mg  20 mg Oral Daily Charm Rings, NP   20 mg at 09/05/16 1102  . hydrOXYzine (ATARAX/VISTARIL) tablet 25 mg  25 mg Oral Q6H PRN Jackelyn Poling, NP      . loperamide (IMODIUM) capsule 2-4 mg  2-4 mg Oral PRN Jackelyn Poling, NP   4 mg at 09/03/16 2337  . LORazepam (ATIVAN) tablet 1 mg  1 mg Oral Q6H PRN Jomarie Longs, MD       Or  . LORazepam (ATIVAN) injection 1 mg  1 mg Intramuscular Q6H PRN Dimarco Minkin, MD      . magnesium hydroxide (MILK OF MAGNESIA) suspension 30 mL  30 mL Oral Daily PRN Charm Rings, NP      . multivitamin with minerals tablet 1 tablet  1 tablet Oral Daily Jackelyn Poling,  NP   1 tablet at 09/05/16 1102  . ondansetron (ZOFRAN-ODT) disintegrating tablet 4 mg  4 mg Oral Q6H PRN Jackelyn PolingJason A Berry, NP   4 mg at 09/03/16 2336  . QUEtiapine (SEROQUEL) tablet 50 mg  50 mg Oral TID PRN Jomarie LongsSaramma Violia Knopf, MD      . traZODone (DESYREL) tablet 100 mg  100 mg Oral QHS Charm RingsJamison Y Lord, NP        Lab Results: No results found for this or any previous visit (from the past 48 hour(s)).  Blood Alcohol level:  Lab Results  Component Value Date   ETH <5 09/02/2016   ETH <5 11/27/2015    Metabolic Disorder Labs: No results found for: HGBA1C, MPG No results found for: PROLACTIN No results found for: CHOL, TRIG, HDL, CHOLHDL, VLDL, LDLCALC  Physical Findings: AIMS: Facial and Oral Movements Muscles of Facial Expression: None, normal Lips and Perioral Area: None, normal Jaw: None, normal Tongue: None, normal,Extremity Movements Upper (arms, wrists, hands, fingers): None, normal Lower  (legs, knees, ankles, toes): None, normal, Trunk Movements Neck, shoulders, hips: None, normal, Overall Severity Severity of abnormal movements (highest score from questions above): None, normal Incapacitation due to abnormal movements: None, normal Patient's awareness of abnormal movements (rate only patient's report): No Awareness, Dental Status Current problems with teeth and/or dentures?: No Does patient usually wear dentures?: No  CIWA:  CIWA-Ar Total: 4 COWS:     Musculoskeletal: Strength & Muscle Tone: within normal limits Gait & Station: normal Patient leans: N/A  Psychiatric Specialty Exam: Physical Exam  Nursing note and vitals reviewed.   Review of Systems  Psychiatric/Behavioral: Positive for depression. The patient is nervous/anxious.   All other systems reviewed and are negative.   Blood pressure 106/77, pulse 91, temperature 98.3 F (36.8 C), temperature source Oral, resp. rate 18, height 5' 6.25" (1.683 m), weight 58.1 kg (128 lb).Body mass index is 20.5 kg/m.  General Appearance: Fairly Groomed  Eye Contact:  Fair  Speech:  Normal Rate  Volume:  Normal  Mood:  Anxious  Affect:  Congruent  Thought Process:  Goal Directed and Descriptions of Associations: Circumstantial  Orientation:  Full (Time, Place, and Person)  Thought Content:  Rumination  Suicidal Thoughts:  DENIES  Homicidal Thoughts:  No  Memory:  Immediate;   Fair Recent;   Fair Remote;   Fair  Judgement:  Impaired  Insight:  Shallow  Psychomotor Activity:  Restlessness  Concentration:  Concentration: Fair and Attention Span: Fair  Recall:  [employer]Fair  Fund of Knowledge:  Fair  Language:  Fair  Akathisia:  No  Handed:  Right  AIMS (if indicated):     Assets:  Communication Skills Desire for Improvement  ADL's:  Intact  Cognition:  WNL  Sleep:  Number of Hours: 6.25     Treatment Plan Summary:Patient today is seen as less labile , less anxious - tolerating medications well.  Will continue  today 09/05/16 plan as below except where it is noted.  Schizoaffective disorder, bipolar type (HCC) unstable - improving   Daily contact with patient to assess and evaluate symptoms and progress in treatment and Medication management For psychosis: Increase Abilify to 10 mg po qpm. Abilify Maintena 400 mg IM prior to DC.  For PTSD sx; Celexa 20 mg po daily.  For insomnia: Trazodone 100 mg po qhs.  For anxiety/agitation: PRN medications as per unit protocol.  Will continue to monitor vitals ,medication compliance and treatment side effects while patient is here.  Will monitor for medical issues as well as call consult as needed.   Reviewed labs cbc - wnl, cmp - wnl , uds- negative , pregnancy test - negative ,will order tsh, lipid panel, hba1c, pl.  EKG for qtc prolongation.  CSW will continue working on disposition.   Patient to participate in therapeutic milieu .  Eyanna Mcgonagle, MD 09/05/2016, 1:39 PM

## 2016-09-05 NOTE — [facility] Group Notes (Signed)
[facility] Group Notes:  (Clinical Social Work)  09/05/2016     11:00-11:45AM  Summary of Progress/Problems:   The main focus of today's process group was to help patients explore in depth the perceived benefits and costs of unhealthy coping techniques, as well as to identify healthy coping skills that can help patients to stay well and out of the hospital.  The patient expressed herself quite well in group, and although she had to think at length about her responses, did stay on track with the discussion despite the time lapse.    Type of Therapy:  Group Therapy - Process   Participation Level:  Active  Participation Quality:  Attentive and Sharing  Affect:  Blunted  Cognitive:  Oriented  Insight:  Improving  Engagement in Therapy:  Developing/Improving  Modes of Intervention:  Education, Motivational Interviewing  Leslie MantleMareida Grossman-Orr, LCSW 09/05/2016, 12:40 PM

## 2016-09-05 NOTE — Progress Notes (Signed)
D: Patient's self inventory sheet: patient has good sleep, did not recieve sleep medication.good  Appetite, low energy level, good concentration. Rated depression 2/10, hopeless 2/10, anxiety 5/10. SI/HI/AVH: Denies all. Physical complaints are feeling tired. Goal is "positive thinking". Plans to work on "count my blessings".   A: Medications administered, assessed medication knowledge and education given on medication regimen.  Emotional support and encouragement given patient. R: Denies SI and HI , contracts for safety. Safety maintained with 15 minute checks.

## 2016-09-06 LAB — LIPID PANEL
CHOLESTEROL: 161 mg/dL (ref 0–200)
HDL: 58 mg/dL (ref >40)
LDL CALC: 77 mg/dL (ref 0–99)
TRIGLYCERIDES: 132 mg/dL (ref <150)
Total CHOL/HDL Ratio: 2.8 RATIO
VLDL: 26 mg/dL (ref 0–40)

## 2016-09-06 LAB — TSH: TSH: 1.171 u[IU]/mL (ref 0.350–4.500)

## 2016-09-06 MED ORDER — ARIPIPRAZOLE ER 400 MG IM SRER
400.0000 mg | INTRAMUSCULAR | Status: DC
  Administered 2016-09-07: 400 mg via INTRAMUSCULAR

## 2016-09-06 NOTE — Progress Notes (Signed)
Patient attended the evening group session and was attentive to all discussion prompts from the writer. Patient shared her New Years Resolution was to join AA. Patient rated her day an 8 out of 10 and her affect was appropriate.

## 2016-09-06 NOTE — Progress Notes (Signed)
Patient has been up and active on the unit  and has voiced no complaints. She reports that she feels good and has lots of energy. Patient currently denies having pain, -si/hi/a/v hall. Support and encouragement offered, safety maintained on unit, will continue to monitor.

## 2016-09-06 NOTE — Progress Notes (Signed)
D: Patient's self inventory sheet: patient has good sleep, recieved sleep medication.good  Appetite, normal energy level, poor concentration. Rated depression 3/10, hopeless 1/10, anxiety 5/10. SI/HI/AVH: Denies all. Physical complaints are denied. Goal is "to talk to the doctor". Plans to work on "be honest and answer all questions".   A: Medications administered, assessed medication knowledge and education given on medication regimen.  Emotional support and encouragement given patient. R: Denies SI and HI , contracts for safety. Safety maintained with 15 minute checks.

## 2016-09-06 NOTE — [facility] Group Notes (Signed)
[facility] Group Notes:  (Clinical Social Work)  09/06/2016  11:00AM-12:00PM  Summary of Progress/Problems:  The main focus of today's process group was to listen to a variety of genres of music and to identify that different types of music provoke different responses.  The patient then was able to identify personally what was soothing for them, as well as energizing, as well as how patient can personally use this knowledge in sleep habits, with depression, and with other symptoms.  The patient expressed at the beginning of group the overall feeling of "okay" and was able to express her reactions to all of the music.  Type of Therapy:  Music Therapy   Participation Level:  Active  Participation Quality:  Attentive and Sharing  Affect:  Appropriate - varied  Cognitive:  Oriented  Insight:  Engaged  Engagement in Therapy:  Engaged  Modes of Intervention:   Activity, Exploration  Ambrose MantleMareida Grossman-Orr, LCSW 09/06/2016

## 2016-09-06 NOTE — Progress Notes (Signed)
[facility]  09/06/2016 12:37 PM Leslie LawsKerri Engh  MRN:  [id number] Subjective: Patient states " I feel better."  Objective:Patient seen and chart reviewed.Discussed patient with treatment team.  Pt today seen as improving- her anxiety and AH is better. Pt tolerating Abilify well, denies ADRs. Plan to give  abilify maintena IM prior to DC. Pt encouraged to attend groups .     Principal Problem: Schizoaffective disorder, bipolar type (HCC) Diagnosis:   Patient Active Problem List   Diagnosis Date Noted  . Schizoaffective disorder, bipolar type (HCC) [F25.0] 09/04/2016  . PTSD (post-traumatic stress disorder) [F43.10] 09/04/2016  . Tobacco use disorder [F17.200] 09/04/2016  . Alcohol use disorder, mild, abuse [F10.10] 09/04/2016   Total Time spent with patient: 20 minutes  Past Psychiatric History: Please see H&P.   Past Medical History:  Past Medical History:  Diagnosis Date  . Bipolar 1 disorder (HCC)   . Hearing voices     Past Surgical History:  Procedure Laterality Date  . EYE SURGERY    . VENTRICULOPERITONEAL SHUNT     Family History:  Family History  Problem Relation Age of Onset  . Mental retardation Brother   . Schizophrenia Cousin    Family Psychiatric  History: Please see H&P.  Social History:  History  Alcohol Use  . Yes    Comment: 1x week     History  Drug Use No    Social History   Social History  . Marital status: Single    Spouse name: N/A  . Number of children: N/A  . Years of education: N/A   Social History Main Topics  . Smoking status: Never Smoker  . Smokeless tobacco: Never Used  . Alcohol use Yes     Comment: 1x week  . Drug use: No  . Sexual activity: Not Asked   Other Topics Concern  . None   Social History Narrative  . None   Additional Social History:                         Sleep: Fair  Appetite:  Fair  Current Medications: Current Facility-Administered Medications  Medication Dose Route  Frequency Provider Last Rate Last Dose  . alum & mag hydroxide-simeth (MAALOX/MYLANTA) 200-200-20 MG/5ML suspension 30 mL  30 mL Oral Q4H PRN Charm RingsJamison Y Lord, NP      . ARIPiprazole (ABILIFY) tablet 10 mg  10 mg Oral QPM Jomarie LongsSaramma Kerney Hopfensperger, MD   10 mg at 09/05/16 1709  . citalopram (CELEXA) tablet 20 mg  20 mg Oral Daily Charm RingsJamison Y Lord, NP   20 mg at 09/06/16 0900  . hydrOXYzine (ATARAX/VISTARIL) tablet 25 mg  25 mg Oral Q6H PRN Jackelyn PolingJason A Berry, NP      . loperamide (IMODIUM) capsule 2-4 mg  2-4 mg Oral PRN Jackelyn PolingJason A Berry, NP   4 mg at 09/03/16 2337  . LORazepam (ATIVAN) tablet 1 mg  1 mg Oral Q6H PRN Jomarie LongsSaramma Sameer Teeple, MD       Or  . LORazepam (ATIVAN) injection 1 mg  1 mg Intramuscular Q6H PRN Bryndan Bilyk, MD      . magnesium hydroxide (MILK OF MAGNESIA) suspension 30 mL  30 mL Oral Daily PRN Charm RingsJamison Y Lord, NP      . multivitamin with minerals tablet 1 tablet  1 tablet Oral Daily Jackelyn PolingJason A Berry, NP   1 tablet at 09/06/16 0900  . ondansetron (ZOFRAN-ODT) disintegrating tablet 4 mg  4 mg  Oral Q6H PRN Jackelyn Poling, NP   4 mg at 09/03/16 2336  . QUEtiapine (SEROQUEL) tablet 50 mg  50 mg Oral TID PRN Jomarie Longs, MD      . traZODone (DESYREL) tablet 100 mg  100 mg Oral QHS Charm Rings, NP   100 mg at 09/05/16 2101    Lab Results:  Results for orders placed or performed during the hospital encounter of 09/03/16 (from the past 48 hour(s))  TSH     Status: None   Collection Time: 09/06/16  6:33 AM  Result Value Ref Range   TSH 1.171 0.350 - 4.500 uIU/mL    Comment: Performed by a 3rd Generation assay with a functional sensitivity of <=0.01 uIU/mL. Performed at [facility]   Lipid panel     Status: None   Collection Time: 09/06/16  6:33 AM  Result Value Ref Range   Cholesterol 161 0 - 200 mg/dL   Triglycerides 784 <696 mg/dL   HDL 58 >29 mg/dL   Total CHOL/HDL Ratio 2.8 RATIO   VLDL 26 0 - 40 mg/dL   LDL Cholesterol 77 0 - 99 mg/dL    Comment:        Total  Cholesterol/HDL:CHD Risk Coronary Heart Disease Risk Table                     Men   Women  1/2 Average Risk   3.4   3.3  Average Risk       5.0   4.4  2 X Average Risk   9.6   7.1  3 X Average Risk  23.4   11.0        Use the calculated Patient Ratio above and the CHD Risk Table to determine the patient's CHD Risk.        ATP III CLASSIFICATION (LDL):  <100     mg/dL   Optimal  528-413  mg/dL   Near or Above                    Optimal  130-159  mg/dL   Borderline  244-010  mg/dL   High  >272     mg/dL   Very High Performed at [facility]     Blood Alcohol level:  Lab Results  Component Value Date   [facility] <5 09/02/2016   ETH <5 11/27/2015    Metabolic Disorder Labs: No results found for: HGBA1C, MPG No results found for: PROLACTIN Lab Results  Component Value Date   CHOL 161 09/06/2016   TRIG 132 09/06/2016   HDL 58 09/06/2016   CHOLHDL 2.8 09/06/2016   VLDL 26 09/06/2016   LDLCALC 77 09/06/2016    Physical Findings: AIMS: Facial and Oral Movements Muscles of Facial Expression: None, normal Lips and Perioral Area: None, normal Jaw: None, normal Tongue: None, normal,Extremity Movements Upper (arms, wrists, hands, fingers): None, normal Lower (legs, knees, ankles, toes): None, normal, Trunk Movements Neck, shoulders, hips: None, normal, Overall Severity Severity of abnormal movements (highest score from questions above): None, normal Incapacitation due to abnormal movements: None, normal Patient's awareness of abnormal movements (rate only patient's report): No Awareness, Dental Status Current problems with teeth and/or dentures?: No Does patient usually wear dentures?: No  CIWA:  CIWA-Ar Total: 4 COWS:     Musculoskeletal: Strength & Muscle Tone: within normal limits Gait & Station: normal Patient leans: N/A  Psychiatric Specialty Exam: Physical Exam  Nursing note and  vitals reviewed.   Review of Systems  Psychiatric/Behavioral: Positive for  depression. The patient is nervous/anxious.   All other systems reviewed and are negative.   Blood pressure 114/73, pulse (!) 102, temperature 98.6 F (37 C), temperature source Oral, resp. rate 16, height 5' 6.25" (1.683 m), weight 58.1 kg (128 lb).Body mass index is 20.5 kg/m.  General Appearance: Fairly Groomed  Eye Contact:  Fair  Speech:  Normal Rate  Volume:  Normal  Mood:  Anxious  Affect:  Congruent  Thought Process:  Goal Directed and Descriptions of Associations: Circumstantial  Orientation:  Full (Time, Place, and Person)  Thought Content:  Rumination  Suicidal Thoughts:  DENIES  Homicidal Thoughts:  No  Memory:  Immediate;   Fair Recent;   Fair Remote;   Fair  Judgement:  Fair  Insight:  Fair  Psychomotor Activity:  Restlessness  Concentration:  Concentration: Fair and Attention Span: Fair  Recall:  [employer]Fair  Fund of Knowledge:  Fair  Language:  Fair  Akathisia:  No  Handed:  Right  AIMS (if indicated):     Assets:  Communication Skills Desire for Improvement  ADL's:  Intact  Cognition:  WNL  Sleep:  Number of Hours: 6.75     Treatment Plan Summary:Patient today is seen as less anxious - improving.    Will continue today 09/06/16 plan as below except where it is noted.  Schizoaffective disorder, bipolar type (HCC) unstable - improving   Daily contact with patient to assess and evaluate symptoms and progress in treatment and Medication management For psychosis: Increased Abilify to 10 mg po qpm. Abilify Maintena 400 mg IM prior to DC.  For PTSD sx; Celexa 20 mg po daily.  For insomnia: Trazodone 100 mg po qhs.  For anxiety/agitation: PRN medications as per unit protocol.  Will continue to monitor vitals ,medication compliance and treatment side effects while patient is here.   Will monitor for medical issues as well as call consult as needed.   Reviewed labs  tsh- wnl , lipid panel- wnl , pending hba1c, pl.  EKG for qtc  prolongation.  CSW will continue working on disposition.   Patient to participate in therapeutic milieu .  Quientin Jent, MD 09/06/2016, 12:37 PM

## 2016-09-07 LAB — PROLACTIN: PROLACTIN: 52.7 ng/mL — AB (ref 4.8–23.3)

## 2016-09-07 NOTE — Progress Notes (Signed)
[facility] MD Progress Note  09/07/2016 1:27 PM Leslie Farrell  MRN:  [id number] Subjective: Patient states " I am OK.'  Objective:Patient seen and chart reviewed.Discussed patient with treatment team.  Pt seen as calm. Pt denies new concerns. Pt to get her LAI abilfy maintena IM today. Per staff - no disruptive issues noted.       Principal Problem: Schizoaffective disorder, bipolar type (HCC) Diagnosis:   Patient Active Problem List   Diagnosis Date Noted  . Schizoaffective disorder, bipolar type (HCC) [F25.0] 09/04/2016  . PTSD (post-traumatic stress disorder) [F43.10] 09/04/2016  . Tobacco use disorder [F17.200] 09/04/2016  . Alcohol use disorder, mild, abuse [F10.10] 09/04/2016   Total Time spent with patient: 20 minutes  Past Psychiatric History: Please see H&P.   Past Medical History:  Past Medical History:  Diagnosis Date  . Bipolar 1 disorder (HCC)   . Hearing voices     Past Surgical History:  Procedure Laterality Date  . EYE SURGERY    . VENTRICULOPERITONEAL SHUNT     Family History:  Family History  Problem Relation Age of Onset  . Mental retardation Brother   . Schizophrenia Cousin    Family Psychiatric  History: Please see H&P.  Social History:  History  Alcohol Use  . Yes    Comment: 1x week     History  Drug Use No    Social History   Social History  . Marital status: Single    Spouse name: N/A  . Number of children: N/A  . Years of education: N/A   Social History Main Topics  . Smoking status: Never Smoker  . Smokeless tobacco: Never Used  . Alcohol use Yes     Comment: 1x week  . Drug use: No  . Sexual activity: Not Asked   Other Topics Concern  . None   Social History Narrative  . None   Additional Social History:                         Sleep: Fair  Appetite:  Fair  Current Medications: Current Facility-Administered Medications  Medication Dose Route Frequency Provider Last Rate Last Dose  . alum & mag  hydroxide-simeth (MAALOX/MYLANTA) 200-200-20 MG/5ML suspension 30 mL  30 mL Oral Q4H PRN Charm Rings, NP      . ARIPiprazole (ABILIFY) tablet 10 mg  10 mg Oral QPM Jomarie Longs, MD   10 mg at 09/06/16 1723  . ARIPiprazole ER SRER 400 mg  400 mg Intramuscular Q28 days Jomarie Longs, MD   400 mg at 09/07/16 0944  . citalopram (CELEXA) tablet 20 mg  20 mg Oral Daily Charm Rings, NP   20 mg at 09/07/16 0855  . LORazepam (ATIVAN) tablet 1 mg  1 mg Oral Q6H PRN Jomarie Longs, MD       Or  . LORazepam (ATIVAN) injection 1 mg  1 mg Intramuscular Q6H PRN Kileen Lange, MD      . magnesium hydroxide (MILK OF MAGNESIA) suspension 30 mL  30 mL Oral Daily PRN Charm Rings, NP      . multivitamin with minerals tablet 1 tablet  1 tablet Oral Daily Jackelyn Poling, NP   1 tablet at 09/07/16 0855  . QUEtiapine (SEROQUEL) tablet 50 mg  50 mg Oral TID PRN Jomarie Longs, MD      . traZODone (DESYREL) tablet 100 mg  100 mg Oral QHS Charm Rings, NP   100  mg at 09/06/16 2136    Lab Results:  Results for orders placed or performed during the hospital encounter of 09/03/16 (from the past 48 hour(s))  TSH     Status: None   Collection Time: 09/06/16  6:33 AM  Result Value Ref Range   TSH 1.171 0.350 - 4.500 uIU/mL    Comment: Performed by a 3rd Generation assay with a functional sensitivity of <=0.01 uIU/mL. Performed at [facility]   Lipid panel     Status: None   Collection Time: 09/06/16  6:33 AM  Result Value Ref Range   Cholesterol 161 0 - 200 mg/dL   Triglycerides 161 <096 mg/dL   HDL 58 >04 mg/dL   Total CHOL/HDL Ratio 2.8 RATIO   VLDL 26 0 - 40 mg/dL   LDL Cholesterol 77 0 - 99 mg/dL    Comment:        Total Cholesterol/HDL:CHD Risk Coronary Heart Disease Risk Table                     Men   Women  1/2 Average Risk   3.4   3.3  Average Risk       5.0   4.4  2 X Average Risk   9.6   7.1  3 X Average Risk  23.4   11.0        Use the calculated Patient  Ratio above and the CHD Risk Table to determine the patient's CHD Risk.        ATP III CLASSIFICATION (LDL):  <100     mg/dL   Optimal  540-981  mg/dL   Near or Above                    Optimal  130-159  mg/dL   Borderline  191-478  mg/dL   High  >295     mg/dL   Very High Performed at [facility]   Prolactin     Status: Abnormal   Collection Time: 09/06/16  6:33 AM  Result Value Ref Range   Prolactin 52.7 (H) 4.8 - 23.3 ng/mL    Comment: (NOTE) Performed At: [facility] [address] [place], [place] [id number] Mila Homer MD QI:[id number] Performed at [facility]     Blood Alcohol level:  Lab Results  Component Value Date   [facility] <5 09/02/2016   ETH <5 11/27/2015    Metabolic Disorder Labs: No results found for: HGBA1C, MPG Lab Results  Component Value Date   PROLACTIN 52.7 (H) 09/06/2016   Lab Results  Component Value Date   CHOL 161 09/06/2016   TRIG 132 09/06/2016   HDL 58 09/06/2016   CHOLHDL 2.8 09/06/2016   VLDL 26 09/06/2016   LDLCALC 77 09/06/2016    Physical Findings: AIMS: Facial and Oral Movements Muscles of Facial Expression: None, normal Lips and Perioral Area: None, normal Jaw: None, normal Tongue: None, normal,Extremity Movements Upper (arms, wrists, hands, fingers): None, normal Lower (legs, knees, ankles, toes): None, normal, Trunk Movements Neck, shoulders, hips: None, normal, Overall Severity Severity of abnormal movements (highest score from questions above): None, normal Incapacitation due to abnormal movements: None, normal Patient's awareness of abnormal movements (rate only patient's report): No Awareness, Dental Status Current problems with teeth and/or dentures?: No Does patient usually wear dentures?: No  CIWA:  CIWA-Ar Total: 4 COWS:     Musculoskeletal: Strength & Muscle Tone: within normal limits Gait & Station:  normal Patient leans: N/A  Psychiatric Specialty Exam: Physical  Exam  Nursing note and vitals reviewed.   Review of Systems  Psychiatric/Behavioral: Positive for depression. The patient is nervous/anxious.   All other systems reviewed and are negative.   Blood pressure 126/86, pulse 92, temperature 98.5 F (36.9 C), temperature source Oral, resp. rate 15, height 5' 6.25" (1.683 m), weight 58.1 kg (128 lb).Body mass index is 20.5 kg/m.  General Appearance: Fairly Groomed  Eye Contact:  Fair  Speech:  Normal Rate  Volume:  Normal  Mood:  Anxious  Affect:  Congruent  Thought Process:  Goal Directed and Descriptions of Associations: Circumstantial  Orientation:  Full (Time, Place, and Person)  Thought Content:  Rumination  Suicidal Thoughts:  DENIES  Homicidal Thoughts:  No  Memory:  Immediate;   Fair Recent;   Fair Remote;   Fair  Judgement:  Fair  Insight:  Fair  Psychomotor Activity:  Restlessness  Concentration:  Concentration: Fair and Attention Span: Fair  Recall:  [employer]Fair  Fund of Knowledge:  Fair  Language:  Fair  Akathisia:  No  Handed:  Right  AIMS (if indicated):     Assets:  Communication Skills Desire for Improvement  ADL's:  Intact  Cognition:  WNL  Sleep:  Number of Hours: 5.5     Treatment Plan Summary:Patient today I seen as more organized , tolerating medications well, continue to encourage to participate in milieu.   is seen as less anxious - improving.    Will continue today 09/07/16 plan as below except where it is noted.  Schizoaffective disorder, bipolar type (HCC) unstable - improving   Daily contact with patient to assess and evaluate symptoms and progress in treatment and Medication management For psychosis: Increased Abilify to 10 mg po qpm. Abilify Maintena 400 mg IM - first dose - 09/07/2016 , repeat q28 days .  For PTSD sx; Celexa 20 mg po daily.  For insomnia: Trazodone 100 mg po qhs.  For anxiety/agitation: PRN medications as per unit protocol.  Will continue to monitor vitals  ,medication compliance and treatment side effects while patient is here.   Will monitor for medical issues as well as call consult as needed.   Reviewed labs  tsh- wnl , lipid panel- wnl , hba1c- wnl , pl- elevated - will need to be monitored .  Pending EKG for qtc prolongation.  CSW will continue working on disposition.   Patient to participate in therapeutic milieu .  Weylin Plagge, MD 09/07/2016, 1:27 PM

## 2016-09-07 NOTE — Progress Notes (Signed)
Leslie Farrell. Leslie Farrell had been up and visible in milieu this evening, did attend and participate in evening group activity. Leslie Farrell spoke about how she received a shot today and spoke about how she is hopeful for discharge pretty soon. Leslie Farrell was seen interacting appropriately with peers in milieu, did receive bedtime medication without incident and did not verbalize any complaints of pain. A. Support and encouragement provided. R. Safety maintained, will continue to monitor.

## 2016-09-07 NOTE — Progress Notes (Signed)
D:  Pt rated her depression 2/10. Presented in a pleasant manner. Denied SI/HI/AVH.   A: Safety checks maintained.  R: Pt was able to contract for safety. Verbalized no concerns.

## 2016-09-07 NOTE — Progress Notes (Signed)
Pt rated her day a 8 out of 10. Pt goal for tomorrow is to work on getting out of here.

## 2016-09-07 NOTE — BHH Group Notes (Signed)
[facility]   09/07/2016 2:41 PM  Participation Quality:    Invited.  In bed, awake, with covers pulled over head.  Declined to attend.  Plan for Discharge/Comments:    Transportation Means:   Supports:  Ida Rogueodney B Lihanna Biever

## 2016-09-08 LAB — HEMOGLOBIN A1C
Hgb A1c MFr Bld: 4.5 % — ABNORMAL LOW (ref 4.8–5.6)
Mean Plasma Glucose: 82 mg/dL

## 2016-09-08 MED ORDER — TRAZODONE HCL 100 MG PO TABS: 100.0000 mg | ORAL_TABLET | Freq: Every day | ORAL | 0 refills | Status: AC

## 2016-09-08 MED ORDER — ARIPIPRAZOLE ER 400 MG IM SRER: 400.0000 mg | INTRAMUSCULAR | 0 refills | Status: AC

## 2016-09-08 MED ORDER — ARIPIPRAZOLE 10 MG PO TABS: 10.0000 mg | ORAL_TABLET | Freq: Every evening | ORAL | 0 refills | Status: AC

## 2016-09-08 MED ORDER — QUETIAPINE FUMARATE 50 MG PO TABS: 50.0000 mg | ORAL_TABLET | Freq: Three times a day (TID) | ORAL | 0 refills | Status: AC | PRN

## 2016-09-08 MED ORDER — CITALOPRAM HYDROBROMIDE 20 MG PO TABS: 20.0000 mg | ORAL_TABLET | Freq: Every day | ORAL | 0 refills | Status: AC
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
## 2016-09-08 NOTE — BHH Suicide Risk Assessment (Signed)
[facility]   Principal Problem: Schizoaffective disorder, bipolar type [facility](HCC) Discharge Diagnoses:  Patient Active Problem List   Diagnosis Date Noted  . Schizoaffective disorder, bipolar type (HCC) [F25.0] 09/04/2016  . PTSD (post-traumatic stress disorder) [F43.10] 09/04/2016  . Tobacco use disorder [F17.200] 09/04/2016  . Alcohol use disorder, mild, abuse [F10.10] 09/04/2016    Total Time spent with patient: 30 minutes  Musculoskeletal: Strength & Muscle Tone: within normal limits Gait & Station: normal Patient leans: N/A  Psychiatric Specialty Exam: Review of Systems  Psychiatric/Behavioral: Negative for depression and suicidal ideas. The patient is not nervous/anxious.   All other systems reviewed and are negative.   Blood pressure 111/85, pulse 96, temperature 98.8 F (37.1 C), temperature source Oral, resp. rate 18, height 5' 6.25" (1.683 m), weight 58.1 kg (128 lb).Body mass index is 20.5 kg/m.  General Appearance: Casual  Eye Contact::  Fair  Speech:  Clear and Coherent409  Volume:  Normal  Mood:  Euthymic  Affect:  Appropriate  Thought Process:  Goal Directed and Descriptions of Associations: Intact  Orientation:  Full (Time, Place, and Person)  Thought Content:  Logical  Suicidal Thoughts:  No  Homicidal Thoughts:  No  Memory:  Immediate;   Fair Recent;   Fair Remote;   Fair  Judgement:  Fair  Insight:  Fair  Psychomotor Activity:  Normal  Concentration:  Fair  Recall:  [employer]Fair  Fund of Knowledge:Fair  Language: Fair  Akathisia:  No  Handed:  Right  AIMS (if indicated):     Assets:  Communication Skills Desire for Improvement  Sleep:  Number of Hours: 6.5  Cognition: WNL  ADL's:  Intact   Mental Status Per Nursing Assessment::   On Admission:  NA  Demographic Factors:  Caucasian  Loss Factors: NA  Historical Factors: Impulsivity  Risk Reduction Factors:   Positive social support and Positive therapeutic  relationship  Continued Clinical Symptoms:  Previous Psychiatric Diagnoses and Treatments  Cognitive Features That Contribute To Risk:  None    Suicide Risk:  Minimal: No identifiable suicidal ideation.  Patients presenting with no risk factors but with morbid ruminations; may be classified as minimal risk based on the severity of the depressive symptoms  Follow-up Information    [facility]   Specialty:  Behavioral Health Why:  Social Worker will call to confirm your next appointment date/time. Contact information: [address] [place] [address] [phone]           Plan Of Care/Follow-up recommendations:  Activity:  no restrictions Diet:  regular Other:  Abilify Maintena 400 mg IM - first dose 09/07/16 - repeat q28 days  Rieley Khalsa, MD 09/08/2016, 9:38 AM

## 2016-09-08 NOTE — Plan of Care (Signed)
[facility] The[facility] Interventions Goal: STG-Patient will demonstrate improved communication skills b STG: Communication - Patient will improve communication skills, as demonstrated by ability to actively participate in at least 2 processing discussion during recreation therapy group sessions by conclusion of recreation therapy tx  Outcome: Adequate for Discharge Pt demonstrated improved communication by attending communication recreation therapy session.  Caroll RancherMarjette Adessa Primiano, LRT/CTRS

## 2016-09-08 NOTE — Tx Team (Signed)
Interdisciplinary Treatment and Diagnostic Plan Update  09/08/2016 Time of Session: 9:00am  Leslie LawsKerri Megill MRN: [id number]  Principal Diagnosis: Schizoaffective disorder, bipolar type (HCC)  Secondary Diagnoses: Principal Problem:   Schizoaffective disorder, bipolar type (HCC) Active Problems:   PTSD (post-traumatic stress disorder)   Tobacco use disorder   Alcohol use disorder, mild, abuse   Current Medications:  Current Facility-Administered Medications  Medication Dose Route Frequency Provider Last Rate Last Dose  . alum & mag hydroxide-simeth (MAALOX/MYLANTA) 200-200-20 MG/5ML suspension 30 mL  30 mL Oral Q4H PRN Charm RingsJamison Y Lord, NP      . ARIPiprazole (ABILIFY) tablet 10 mg  10 mg Oral QPM Jomarie LongsSaramma Eappen, MD   10 mg at 09/07/16 1815  . ARIPiprazole ER SRER 400 mg  400 mg Intramuscular Q28 days Jomarie LongsSaramma Eappen, MD   400 mg at 09/07/16 0944  . citalopram (CELEXA) tablet 20 mg  20 mg Oral Daily Charm RingsJamison Y Lord, NP   20 mg at 09/08/16 0744  . LORazepam (ATIVAN) tablet 1 mg  1 mg Oral Q6H PRN Jomarie LongsSaramma Eappen, MD       Or  . LORazepam (ATIVAN) injection 1 mg  1 mg Intramuscular Q6H PRN Saramma Eappen, MD      . magnesium hydroxide (MILK OF MAGNESIA) suspension 30 mL  30 mL Oral Daily PRN Charm RingsJamison Y Lord, NP      . multivitamin with minerals tablet 1 tablet  1 tablet Oral Daily Jackelyn PolingJason A Berry, NP   1 tablet at 09/08/16 0744  . QUEtiapine (SEROQUEL) tablet 50 mg  50 mg Oral TID PRN Jomarie LongsSaramma Eappen, MD      . traZODone (DESYREL) tablet 100 mg  100 mg Oral QHS Charm RingsJamison Y Lord, NP   100 mg at 09/07/16 2102   PTA Medications: Prescriptions Prior to Admission  Medication Sig Dispense Refill Last Dose  . acetaminophen (TYLENOL) 325 MG tablet Take 650 mg by mouth every 6 (six) hours as needed.   Past Month at Unknown time  . citalopram (CELEXA) 20 MG tablet take 20mg  by mouth every monring  0 09/01/2016 at Unknown time  . ibuprofen (ADVIL,MOTRIN) 600 MG tablet Take 1 tablet (600 mg total) by mouth every  6 (six) hours as needed. 30 tablet 0 Past Week at Unknown time  . QUEtiapine (SEROQUEL) 300 MG tablet Take 150 mg by mouth at bedtime.  0 09/01/2016 at Unknown time  . amoxicillin-clavulanate (AUGMENTIN) 875-125 MG tablet Take 1 tablet by mouth every 12 (twelve) hours. (Patient not taking: Reported on 09/04/2016) 14 tablet 0 Not Taking at Unknown time  . methocarbamol (ROBAXIN) 500 MG tablet Take 2 tablets (1,000 mg total) by mouth every 8 (eight) hours as needed for muscle spasms. (Patient not taking: Reported on 09/04/2016) 30 tablet 0 Not Taking at Unknown time  . traMADol (ULTRAM) 50 MG tablet Take 1 tablet (50 mg total) by mouth every 6 (six) hours as needed. (Patient not taking: Reported on 09/04/2016) 10 tablet 0 Not Taking at Unknown time    Patient Stressors: Health problems Medication change or noncompliance  Patient Strengths: Manufacturing systems engineerCommunication skills Supportive family/friends  Treatment Modalities: Medication Management, Group therapy, Case management,  1 to 1 session with clinician, Psychoeducation, Recreational therapy.   Physician Treatment Plan for Primary Diagnosis: Schizoaffective disorder, bipolar type (HCC) Long Term Goal(s): Improvement in symptoms so as ready for discharge Improvement in symptoms so as ready for discharge   Short Term Goals: Ability to verbalize feelings will improve Compliance with prescribed medications will  improve Ability to identify changes in lifestyle to reduce recurrence of condition will improve Compliance with prescribed medications will improve  Medication Management: Evaluate patient's response, side effects, and tolerance of medication regimen.  Therapeutic Interventions: 1 to 1 sessions, Unit Group sessions and Medication administration.  Evaluation of Outcomes: Adequate for Discharge  Physician Treatment Plan for Secondary Diagnosis: Principal Problem:   Schizoaffective disorder, bipolar type (HCC) Active Problems:   PTSD  (post-traumatic stress disorder)   Tobacco use disorder   Alcohol use disorder, mild, abuse  Long Term Goal(s): Improvement in symptoms so as ready for discharge Improvement in symptoms so as ready for discharge   Short Term Goals: Ability to verbalize feelings will improve Compliance with prescribed medications will improve Ability to identify changes in lifestyle to reduce recurrence of condition will improve Compliance with prescribed medications will improve     Medication Management: Evaluate patient's response, side effects, and tolerance of medication regimen.  Therapeutic Interventions: 1 to 1 sessions, Unit Group sessions and Medication administration.  Evaluation of Outcomes: Adequate for Discharge   RN Treatment Plan for Primary Diagnosis: Schizoaffective disorder, bipolar type (HCC) Long Term Goal(s): Knowledge of disease and therapeutic regimen to maintain health will improve  Short Term Goals: Ability to verbalize frustration and anger appropriately will improve, Ability to participate in decision making will improve, Ability to verbalize feelings will improve, Ability to identify and develop effective coping behaviors will improve and Compliance with prescribed medications will improve  Medication Management: RN will administer medications as ordered by provider, will assess and evaluate patient's response and provide education to patient for prescribed medication. RN will report any adverse and/or side effects to prescribing provider.  Therapeutic Interventions: 1 on 1 counseling sessions, Psychoeducation, Medication administration, Evaluate responses to treatment, Monitor vital signs and CBGs as ordered, Perform/monitor CIWA, COWS, AIMS and Fall Risk screenings as ordered, Perform wound care treatments as ordered.  Evaluation of Outcomes: Adequate for Discharge   Recreational Therapy Treatment Plan for Primary Diagnosis: Schizoaffective disorder, bipolar type  (HCC) Long Term Goal(s): LTG- Patient will participate in recreation therapy tx in at least 2 group sessions without prompting from LRT.  Short Term Goals: Without prompting or encouragement patient will spontaneously contribute to discussions during at least 2 recreation therapy group sessions by conclusion of recreation therapy tx.  Treatment Modalities: Group and Pet Therapy  Therapeutic Interventions: Psychoeducation  Evaluation of Outcomes: Adequate for Discharge   LCSW Treatment Plan for Primary Diagnosis: Schizoaffective disorder, bipolar type (HCC) Long Term Goal(s): Safe transition to appropriate next level of care at discharge, Engage patient in therapeutic group addressing interpersonal concerns.  Short Term Goals: Engage patient in aftercare planning with referrals and resources, Increase social support, Increase ability to appropriately verbalize feelings, Increase emotional regulation, Facilitate acceptance of mental health diagnosis and concerns, Identify triggers associated with mental health/substance abuse issues and Increase skills for wellness and recovery  Therapeutic Interventions: Assess for all discharge needs, 1 to 1 time with Social worker, Explore available resources and support systems, Assess for adequacy in community support network, Educate family and significant other(s) on suicide prevention, Complete Psychosocial Assessment, Interpersonal group therapy.  Evaluation of Outcomes: Adequate for Discharge   Progress in Treatment: Attending groups: Yes. Participating in groups: Yes. Taking medication as prescribed: Yes. Toleration medication: Yes. Family/Significant other contact made: Yes Patient understands diagnosis: Yes. Discussing patient identified problems/goals with staff: Yes. Medical problems stabilized or resolved: Yes. Denies suicidal/homicidal ideation: Yes. Issues/concerns per patient self-inventory: No. Other: NA  New problem(s)  identified: No, Describe:  NA  New Short Term/Long Term Goal(s):  Discharge Plan or Barriers: Pt plans to return home and follow up with outpatient.    Reason for Continuation of Hospitalization:   Estimated Length of Stay: D/C today   Attendees: Patient: 09/08/2016 10:38 AM  Physician: Jomarie Longs, MD  09/08/2016 10:38 AM  Nursing: Dorien Chihuahua, RN  09/08/2016 10:38 AM  RN Care Manager: Victorino Dike, RN  09/08/2016 10:38 AM  Social Worker: Ida Rogue, LCSWA  09/08/2016 10:38 AM  Recreational Therapist:  09/08/2016 10:38 AM  Other:  09/08/2016 10:38 AM  Other:  09/08/2016 10:38 AM  Other: 09/08/2016 10:38 AM    Scribe for Treatment Team: Ida Rogue, LCSW 09/08/2016 10:38 AM

## 2016-09-08 NOTE — [facility] Suicide Risk Assessment (Signed)
[facility] INPATIENT:  Family/Significant Other Suicide Prevention Education  Suicide Prevention Education:  Education Completed; No one  has been identified by the patient as the family member/significant other with whom the patient will be residing, and identified as the person(s) who will aid the patient in the event of a mental health crisis (suicidal ideations/suicide attempt).  With written consent from the patient, the family member/significant other has been provided the following suicide prevention education, prior to the and/or following the discharge of the patient.  The suicide prevention education provided includes the following:  Suicide risk factors  Suicide prevention and interventions  National Suicide Hotline telephone number  [facility] assessment telephone number  [facility] 911  [facility] and/or Residential Mobile Crisis Unit telephone number  Request made of family/significant other to:  Remove weapons (e.g., guns, rifles, knives), all items previously/currently identified as safety concern.    Remove drugs/medications (over-the-counter, prescriptions, illicit drugs), all items previously/currently identified as a safety concern.  The family member/significant other verbalizes understanding of the suicide prevention education information provided.  The family member/significant other agrees to remove the items of safety concern listed above. The patient did not endorse SI at the time of admission, nor did the patient c/o SI during the stay here.  SPE not required. However, I did talk to mother, Haze RushingCindy Park, [phone] to go over crises plan. Ida RogueRodney B Jadamarie Butson 09/08/2016, 10:44 AM

## 2016-09-08 NOTE — Progress Notes (Signed)
Recreation Therapy Notes  Date: 09/08/16 Time: 1000 Location: 500 Hall Dayroom   Group Topic: Communication, Team Building, Problem Solving  Goal Area(s) Addresses:  Patient will effectively work with peer towards shared goal.  Patient will identify skill used to make activity successful.  Patient will identify how skills used during activity can be used to reach post d/c goals.   Behavioral Response: Engaged  Intervention: STEM Activity   Activity: [place] [employer]. Patients were provided the following materials: 5 drinking straws, 5 rubber bands, 5 paper clips, 2 index cards, 2 drinking cups, and 2 toilet paper rolls. Using the provided materials patients were asked to build a launching mechanisms to launch a ping pong ball approximately 12 feet. Patients were divided into teams of 3-5.   Education: Pharmacist, communityocial Skills, Building control surveyorDischarge Planning.   Education Outcome: Acknowledges education/In group clarification offered/Needs additional education.   Clinical Observations/Feedback: Pt worked quietly and was focused in group.  Pt seemed determined to come up with a concept based on how she didn't give up and persisted through frustration.   Caroll RancherMarjette Lizeth Bencosme, LRT/CTRS         Caroll RancherLindsay, Caidyn Henricksen A 09/08/2016 11:36 AM

## 2016-09-08 NOTE — Progress Notes (Signed)
  [facility] :  Will you be returning to the same living situation after discharge:  Yes,  home At discharge, do you have transportation home?: Yes,  mother Do you have the ability to pay for your medications: Yes,  MCD  Release of information consent forms completed and in the chart;  Patient's signature needed at discharge.  Patient to Follow up at: Follow-up Information    [facility] Follow up on 09/11/2016.   Specialty:  Behavioral Health Why:  Friday at 8:00 for an 8;20 appointment with Ms Judith BlonderButler Contact information: [address] [place] [address] [phone]           Next level of care provider [facility] Link:no  Safety Planning and Suicide Prevention discussed: Yes,  yes  Have you used any form of tobacco in the last 30 days? (Cigarettes, Smokeless Tobacco, Cigars, and/or Pipes): Yes  Has patient been referred to the Quitline?: Patient refused referral  Patient has been referred for addiction treatment: N/A  Ida RogueRodney B Avarey Yaeger 09/08/2016, 10:49 AM

## 2016-09-16 ENCOUNTER — Other Ambulatory Visit: Payer: Self-pay | Admitting: Obstetrics and Gynecology

## 2016-09-17 LAB — CYTOLOGY - PAP
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
